# Patient Record
Sex: Male | Born: 1955
Health system: Southern US, Community
[De-identification: ages and names within clinical notes are randomized; demographics above are authoritative.]

## PROBLEM LIST (undated history)

## (undated) DIAGNOSIS — B0059 Other herpesviral disease of eye: Secondary | ICD-10-CM

## (undated) DIAGNOSIS — Z85828 Personal history of other malignant neoplasm of skin: Secondary | ICD-10-CM

## (undated) DIAGNOSIS — Z87891 Personal history of nicotine dependence: Secondary | ICD-10-CM

## (undated) DIAGNOSIS — B182 Chronic viral hepatitis C: Secondary | ICD-10-CM

## (undated) DIAGNOSIS — D649 Anemia, unspecified: Secondary | ICD-10-CM

## (undated) DIAGNOSIS — H269 Unspecified cataract: Secondary | ICD-10-CM

## (undated) DIAGNOSIS — K7460 Unspecified cirrhosis of liver: Secondary | ICD-10-CM

## (undated) DIAGNOSIS — E785 Hyperlipidemia, unspecified: Secondary | ICD-10-CM

## (undated) DIAGNOSIS — G44009 Cluster headache syndrome, unspecified, not intractable: Principal | ICD-10-CM

## (undated) HISTORY — DX: Unspecified cataract: H26.9

## (undated) HISTORY — DX: Cluster headache syndrome, unspecified, not intractable: G44.009

## (undated) HISTORY — DX: Anemia, unspecified: D64.9

## (undated) HISTORY — DX: Personal history of nicotine dependence: Z87.891

## (undated) HISTORY — DX: Hyperlipidemia, unspecified: E78.5

## (undated) HISTORY — DX: Chronic viral hepatitis C: B18.2

## (undated) HISTORY — DX: Unspecified cirrhosis of liver: K74.60

## (undated) HISTORY — DX: Personal history of other malignant neoplasm of skin: Z85.828

## (undated) HISTORY — PX: WISDOM TOOTH EXTRACTION: SHX21

---

## 1993-08-06 DIAGNOSIS — G44009 Cluster headache syndrome, unspecified, not intractable: Secondary | ICD-10-CM

## 1993-08-06 HISTORY — DX: Cluster headache syndrome, unspecified, not intractable: G44.009

## 2003-03-29 ENCOUNTER — Emergency Department (HOSPITAL_COMMUNITY): Admission: EM | Admit: 2003-03-29 | Discharge: 2003-03-29 | Payer: Self-pay | Admitting: Emergency Medicine

## 2004-04-13 ENCOUNTER — Ambulatory Visit: Payer: Self-pay | Admitting: Internal Medicine

## 2004-04-19 ENCOUNTER — Ambulatory Visit: Payer: Self-pay | Admitting: Internal Medicine

## 2004-04-21 ENCOUNTER — Ambulatory Visit: Payer: Self-pay | Admitting: Internal Medicine

## 2004-04-26 ENCOUNTER — Ambulatory Visit: Payer: Self-pay | Admitting: Internal Medicine

## 2012-06-20 ENCOUNTER — Emergency Department (HOSPITAL_COMMUNITY)
Admission: EM | Admit: 2012-06-20 | Discharge: 2012-06-20 | Disposition: A | Discharge status: Home / Self Care | Payer: BC Managed Care – PPO | Source: Home / Self Care | Attending: Family Medicine | Admitting: Family Medicine

## 2012-06-20 ENCOUNTER — Encounter (HOSPITAL_COMMUNITY): Payer: Self-pay

## 2012-06-20 DIAGNOSIS — B0239 Other herpes zoster eye disease: Secondary | ICD-10-CM

## 2012-06-20 HISTORY — DX: Other herpesviral disease of eye: B00.59

## 2012-06-20 MED ORDER — VALACYCLOVIR HCL 1 G PO TABS: 1000.0000 mg | ORAL_TABLET | Freq: Three times a day (TID) | ORAL | Status: DC

## 2012-06-20 MED ORDER — ACYCLOVIR 5 % EX OINT: TOPICAL_OINTMENT | CUTANEOUS | Status: DC

## 2012-06-20 NOTE — ED Notes (Signed)
Reported hist of herpetic issues of eye , feels as if it is coming back

## 2012-06-20 NOTE — ED Provider Notes (Signed)
History     CSN: [id number]  Arrival date & time 06/20/12  1055   First MD Initiated Contact with Patient 06/20/12 1114      Chief Complaint  Patient presents with  . Eye Problem    (Consider location/radiation/quality/duration/timing/severity/associated sxs/prior treatment) Patient is a 56 y.o. male presenting with eye problem. The history is provided by the patient.  Eye Problem  This is a recurrent problem. The current episode started 3 to 5 hours ago. The problem has not changed since onset.The left eye is affected.Injury mechanism: h/o recurrent herpes outbreaks, none in 3 yrs. Pertinent negatives include no photophobia.    Past Medical History  Diagnosis Date  . Herpes simplex eyelid dermatitis     History reviewed. No pertinent past surgical history.  History reviewed. No pertinent family history.  History  Substance Use Topics  . Smoking status: Current Every Day Smoker -- 0.5 packs/day    Types: Cigarettes  . Smokeless tobacco: Not on file  . Alcohol Use: No      Review of Systems  Constitutional: Negative.   Eyes: Negative for photophobia, pain, itching and visual disturbance.  Skin: Positive for rash.    Allergies  Review of patient's allergies indicates no known allergies.  Home Medications   Current Outpatient Rx  Name  Route  Sig  Dispense  Refill  . ACYCLOVIR 5 % EX OINT   Topical   Apply topically every 3 (three) hours.   3 g   1   . VALACYCLOVIR HCL 1 G PO TABS   Oral   Take 1 tablet (1,000 mg total) by mouth 3 (three) times daily.   21 tablet   1     BP 141/91  Pulse 78  Temp 98.2 F (36.8 C) (Oral)  Resp 16  SpO2 95%  Physical Exam  Nursing note and vitals reviewed. Constitutional: He appears well-developed and well-nourished.  Skin: Skin is warm and dry. There is erythema.       ED Course  Procedures (including critical care time)  Labs Reviewed - No data to display No results found.   1. Herpes zoster  dermatitis of eyelid       MDM         Linna Hoff, MD 06/20/12 1214

## 2012-08-06 HISTORY — PX: COLONOSCOPY: SHX174

## 2013-02-27 ENCOUNTER — Other Ambulatory Visit (INDEPENDENT_AMBULATORY_CARE_PROVIDER_SITE_OTHER): Payer: BC Managed Care – PPO

## 2013-02-27 ENCOUNTER — Encounter: Payer: Self-pay | Admitting: Internal Medicine

## 2013-02-27 ENCOUNTER — Ambulatory Visit (INDEPENDENT_AMBULATORY_CARE_PROVIDER_SITE_OTHER): Payer: BC Managed Care – PPO | Admitting: Internal Medicine

## 2013-02-27 VITALS — BP 118/78 | HR 77 | Temp 97.7°F | Resp 16 | Ht 71.0 in

## 2013-02-27 DIAGNOSIS — Z0000 Encounter for general adult medical examination without abnormal findings: Secondary | ICD-10-CM | POA: Insufficient documentation

## 2013-02-27 DIAGNOSIS — F17200 Nicotine dependence, unspecified, uncomplicated: Secondary | ICD-10-CM | POA: Insufficient documentation

## 2013-02-27 DIAGNOSIS — Z23 Encounter for immunization: Secondary | ICD-10-CM

## 2013-02-27 DIAGNOSIS — G44009 Cluster headache syndrome, unspecified, not intractable: Secondary | ICD-10-CM

## 2013-02-27 DIAGNOSIS — R7989 Other specified abnormal findings of blood chemistry: Secondary | ICD-10-CM

## 2013-02-27 DIAGNOSIS — B1920 Unspecified viral hepatitis C without hepatic coma: Secondary | ICD-10-CM

## 2013-02-27 LAB — COMPREHENSIVE METABOLIC PANEL
Alkaline Phosphatase: 54 U/L (ref 39–117)
BUN: 15 mg/dL (ref 6–23)
CO2: 25 mEq/L (ref 19–32)
Creatinine, Ser: 0.8 mg/dL (ref 0.4–1.5)
GFR: 101.48 mL/min (ref >60.00)
Glucose, Bld: 84 mg/dL (ref 70–99)
Total Bilirubin: 0.7 mg/dL (ref 0.3–1.2)
Total Protein: 7.7 g/dL (ref 6.0–8.3)

## 2013-02-27 LAB — CBC WITH DIFFERENTIAL/PLATELET
Basophils Relative: 0.6 % (ref 0.0–3.0)
Eosinophils Relative: 1.7 % (ref 0.0–5.0)
HCT: 49.3 % (ref 39.0–52.0)
Hemoglobin: 17.1 g/dL — ABNORMAL HIGH (ref 13.0–17.0)
Lymphs Abs: 2 10*3/uL (ref 0.7–4.0)
Monocytes Relative: 9.3 % (ref 3.0–12.0)
Platelets: 252 10*3/uL (ref 150.0–400.0)
RBC: 4.84 Mil/uL (ref 4.22–5.81)
WBC: 5.2 10*3/uL (ref 4.5–10.5)

## 2013-02-27 LAB — LIPID PANEL
Cholesterol: 179 mg/dL (ref 0–200)
Total CHOL/HDL Ratio: 5
VLDL: 40.4 mg/dL — ABNORMAL HIGH (ref 0.0–40.0)

## 2013-02-27 LAB — HEPATITIS C ANTIBODY: HCV Ab: REACTIVE — AB

## 2013-02-27 LAB — LDL CHOLESTEROL, DIRECT: Direct LDL: 111.5 mg/dL

## 2013-02-27 MED ORDER — ELETRIPTAN HYDROBROMIDE 40 MG PO TABS: 40.0000 mg | ORAL_TABLET | ORAL | Status: DC | PRN

## 2013-02-27 NOTE — Progress Notes (Signed)
  Subjective:    Patient ID: Micheal Doyle, male    DOB: 12/26/1955, 57 y.o.   MRN: [id number]  HPI  New to me he has a history of cluster headache, last headache was about 18 months ago. He wants to keep a medication on hand to use if he develops another headache. He feels well today and offers no complaints.   Review of Systems  Constitutional: Negative.  Negative for fever, chills, diaphoresis, activity change, appetite change, fatigue and unexpected weight change.  HENT: Negative.   Eyes: Negative.   Respiratory: Negative.  Negative for cough, chest tightness, shortness of breath, wheezing and stridor.   Cardiovascular: Negative.  Negative for chest pain, palpitations and leg swelling.  Gastrointestinal: Negative.  Negative for nausea, vomiting, abdominal pain, diarrhea, constipation and blood in stool.  Endocrine: Negative.   Genitourinary: Negative.  Negative for dysuria, urgency, frequency, hematuria, decreased urine volume, scrotal swelling and difficulty urinating.  Musculoskeletal: Negative.   Skin: Negative.   Allergic/Immunologic: Negative.   Neurological: Negative.  Negative for dizziness and weakness.  Hematological: Negative.  Negative for adenopathy. Does not bruise/bleed easily.  Psychiatric/Behavioral: Negative.        Objective:   Physical Exam  Vitals reviewed. Constitutional: He is oriented to person, place, and time. He appears well-developed and well-nourished. No distress.  HENT:  Head: Normocephalic and atraumatic.  Mouth/Throat: Oropharynx is clear and moist. No oropharyngeal exudate.  Eyes: Conjunctivae are normal. Right eye exhibits no discharge. Left eye exhibits no discharge. No scleral icterus.  Neck: Normal range of motion. Neck supple. No JVD present. No tracheal deviation present. No thyromegaly present.  Cardiovascular: Normal rate, regular rhythm, normal heart sounds and intact distal pulses.  Exam reveals no gallop and no friction rub.   No  murmur heard. Pulmonary/Chest: Effort normal and breath sounds normal. No stridor. No respiratory distress. He has no wheezes. He has no rales. He exhibits no tenderness.  Abdominal: Soft. Bowel sounds are normal. He exhibits no distension and no mass. There is no tenderness. There is no rebound and no guarding. Hernia confirmed negative in the right inguinal area and confirmed negative in the left inguinal area.  Genitourinary: Rectum normal, prostate normal and testes normal. Rectal exam shows no external hemorrhoid, no internal hemorrhoid, no fissure, no mass, no tenderness and anal tone normal. Guaiac negative stool. Prostate is not enlarged and not tender. Right testis shows no mass, no swelling and no tenderness. Right testis is descended. Left testis shows no mass, no swelling and no tenderness. Left testis is descended. Circumcised. No penile erythema or penile tenderness. No discharge found.  Musculoskeletal: Normal range of motion. He exhibits no edema and no tenderness.  Lymphadenopathy:    He has no cervical adenopathy.       Right: No inguinal adenopathy present.       Left: No inguinal adenopathy present.  Neurological: He is oriented to person, place, and time.  Skin: Skin is warm and dry. No rash noted. He is not diaphoretic. No erythema. No pallor.  Psychiatric: He has a normal mood and affect. His behavior is normal. Judgment and thought content normal.      No results found for this basename: WBC, HGB, HCT, PLT, GLUCOSE, CHOL, TRIG, HDL, LDLDIRECT, LDLCALC, ALT, AST, NA, K, CL, CREATININE, BUN, CO2, TSH, PSA, INR, GLUF, HGBA1C, MICROALBUR      Assessment & Plan:

## 2013-02-27 NOTE — Patient Instructions (Signed)
Health Maintenance, Males A healthy lifestyle and preventative care can promote health and wellness.  Maintain regular health, dental, and eye exams.  Eat a healthy diet. Foods like vegetables, fruits, whole grains, low-fat dairy products, and lean protein foods contain the nutrients you need without too many calories. Decrease your intake of foods high in solid fats, added sugars, and salt. Get information about a proper diet from your caregiver, if necessary.  Regular physical exercise is one of the most important things you can do for your health. Most adults should get at least 150 minutes of moderate-intensity exercise (any activity that increases your heart rate and causes you to sweat) each week. In addition, most adults need muscle-strengthening exercises on 2 or more days a week.   Maintain a healthy weight. The body mass index (BMI) is a screening tool to identify possible weight problems. It provides an estimate of body fat based on height and weight. Your caregiver can help determine your BMI, and can help you achieve or maintain a healthy weight. For adults 20 years and older:  A BMI below 18.5 is considered underweight.  A BMI of 18.5 to 24.9 is normal.  A BMI of 25 to 29.9 is considered overweight.  A BMI of 30 and above is considered obese.  Maintain normal blood lipids and cholesterol by exercising and minimizing your intake of saturated fat. Eat a balanced diet with plenty of fruits and vegetables. Blood tests for lipids and cholesterol should begin at age 20 and be repeated every 5 years. If your lipid or cholesterol levels are high, you are over 50, or you are a high risk for heart disease, you may need your cholesterol levels checked more frequently.Ongoing high lipid and cholesterol levels should be treated with medicines, if diet and exercise are not effective.  If you smoke, find out from your caregiver how to quit. If you do not use tobacco, do not start.  If you  choose to drink alcohol, do not exceed 2 drinks per day. One drink is considered to be 12 ounces (355 mL) of beer, 5 ounces (148 mL) of wine, or 1.5 ounces (44 mL) of liquor.  Avoid use of street drugs. Do not share needles with anyone. Ask for help if you need support or instructions about stopping the use of drugs.  High blood pressure causes heart disease and increases the risk of stroke. Blood pressure should be checked at least every 1 to 2 years. Ongoing high blood pressure should be treated with medicines if weight loss and exercise are not effective.  If you are 45 to 57 years old, ask your caregiver if you should take aspirin to prevent heart disease.  Diabetes screening involves taking a blood sample to check your fasting blood sugar level. This should be done once every 3 years, after age 45, if you are within normal weight and without risk factors for diabetes. Testing should be considered at a younger age or be carried out more frequently if you are overweight and have at least 1 risk factor for diabetes.  Colorectal cancer can be detected and often prevented. Most routine colorectal cancer screening begins at the age of 50 and continues through age 75. However, your caregiver may recommend screening at an earlier age if you have risk factors for colon cancer. On a yearly basis, your caregiver may provide home test kits to check for hidden blood in the stool. Use of a small camera at the end of a tube,   to directly examine the colon (sigmoidoscopy or colonoscopy), can detect the earliest forms of colorectal cancer. Talk to your caregiver about this at age 24, when routine screening begins. Direct examination of the colon should be repeated every 5 to 10 years through age 80, unless early forms of pre-cancerous polyps or small growths are found.  Hepatitis C blood testing is recommended for all people born from [birth year] through 1965 and any individual with known risks for hepatitis C.  Healthy  men should no longer receive prostate-specific antigen (PSA) blood tests as part of routine cancer screening. Consult with your caregiver about prostate cancer screening.  Testicular cancer screening is not recommended for adolescents or adult males who have no symptoms. Screening includes self-exam, caregiver exam, and other screening tests. Consult with your caregiver about any symptoms you have or any concerns you have about testicular cancer.  Practice safe sex. Use condoms and avoid high-risk sexual practices to reduce the spread of sexually transmitted infections (STIs).  Use sunscreen with a sun protection factor (SPF) of 30 or greater. Apply sunscreen liberally and repeatedly throughout the day. You should seek shade when your shadow is shorter than you. Protect yourself by wearing long sleeves, pants, a wide-brimmed hat, and sunglasses year round, whenever you are outdoors.  Notify your caregiver of new moles or changes in moles, especially if there is a change in shape or color. Also notify your caregiver if a mole is larger than the size of a pencil eraser.  A one-time screening for abdominal aortic aneurysm (AAA) and surgical repair of large AAAs by sound wave imaging (ultrasonography) is recommended for ages 69 to 19 years who are current or former smokers.  Stay current with your immunizations. Document Released: 01/19/2008 Document Revised: 10/15/2011 Document Reviewed: 12/18/2010 [facility] Patient Information 2014 [place], [place]. Cluster Headache Cluster headaches are recognized by their pattern of deep, intense head pain. They normally occur on one side of the head. Typically, cluster headaches:  Are severe in nature.  Occur repeatedly over weeks to months at a time and are then followed by periods of no headaches.  Can last 15 minutes to 3 hours.  Occur at the same time each day, often at night.  Occur several times a day. CAUSES The exact cause of a cluster headache is  not known. Some things can trigger a cluster headache, such as:  Smoking.  Alcohol use.  Lack of sleep.  High altitude travel, such as airline travel.  Change of seasons.  Certain foods, such as foods or drinks that contain nitrates, glutamate, aspartame, or tyramine. SYMPTOMS   Severe pain that begins in or around the eye or temple.  One-sided head pain.  Feeling sick to your stomach (nauseous).  Sensitivity to light.  Runny nose.  Eye redness, tearing, and nasal stuffiness on the side of your head where you are experiencing pain.  Sweaty, pale skin of the face.  Droopy eyelid. DIAGNOSIS  Cluster headaches are diagnosed based on symptoms and physical exam. Your caregiver may order a computerized X-ray scan (CT or CAT scan) or a computerized magnetic scan (MRI) of your head or other lab tests to see if your headaches are caused from other medical conditions.  TREATMENT   Medications for pain relief and to prevent recurrent attacks.  Oxygen for pain relief.  Biofeedback programs may help reduce headache pain. Some people may need a combination of medicines for cluster headaches. It may be helpful to keep a headache diary. This may help  you find a trend for what is triggering your headaches. Your caregiver can develop a treatment plan that can be helpful to you.  HOME CARE INSTRUCTIONS  During cluster periods:  Follow a regular sleep schedule.Do not vary the amount and time that you sleep from day to day. It is important to stay on the same schedule during a cluster period to help prevent headaches.  Avoid alcohol.  Stop smoking. SEEK IMMEDIATE MEDICAL CARE IF:   You have any changes from your previous cluster headaches either in intensity or frequency.  You are not getting relief from medicines you are taking.  You faint.  You develop weakness or numbness, especially on one side of your body or face.  You develop double vision.  You develop nausea or  vomiting.  You cannot keep your balance or have difficulty talking or walking.  You develop neck pain or neck stiffness.  You have a fever. Document Released: 07/23/2005 Document Revised: 10/15/2011 Document Reviewed: 10/21/2010 [facility] Patient Information 2014 [place], [place].

## 2013-02-27 NOTE — Assessment & Plan Note (Signed)
He will try relpax

## 2013-02-27 NOTE — Assessment & Plan Note (Signed)
Exam done Vaccines were reviewed and updated He was referred for a colonscopy Labs ordered Pt ed material was given

## 2013-02-27 NOTE — Assessment & Plan Note (Signed)
He is not ready to quit smoking 

## 2013-02-28 ENCOUNTER — Encounter: Payer: Self-pay | Admitting: Internal Medicine

## 2013-03-03 ENCOUNTER — Encounter: Payer: Self-pay | Admitting: Gastroenterology

## 2013-03-13 ENCOUNTER — Telehealth: Payer: Self-pay | Admitting: Internal Medicine

## 2013-03-13 DIAGNOSIS — B182 Chronic viral hepatitis C: Secondary | ICD-10-CM | POA: Insufficient documentation

## 2013-03-13 DIAGNOSIS — R768 Other specified abnormal immunological findings in serum: Secondary | ICD-10-CM

## 2013-03-13 NOTE — Telephone Encounter (Signed)
Pt.notified

## 2013-03-13 NOTE — Telephone Encounter (Signed)
Patient would like to come in Monday to have his repeat labs, can orders be placed in the system, thanks

## 2013-03-13 NOTE — Telephone Encounter (Signed)
Lab orders placed.  

## 2013-03-16 ENCOUNTER — Other Ambulatory Visit (INDEPENDENT_AMBULATORY_CARE_PROVIDER_SITE_OTHER): Payer: BC Managed Care – PPO

## 2013-03-16 DIAGNOSIS — R768 Other specified abnormal immunological findings in serum: Secondary | ICD-10-CM

## 2013-03-16 DIAGNOSIS — R894 Abnormal immunological findings in specimens from other organs, systems and tissues: Secondary | ICD-10-CM

## 2013-03-17 LAB — HEPATITIS B CORE ANTIBODY, TOTAL: Hep B Core Total Ab: POSITIVE — AB

## 2013-03-17 LAB — HEPATITIS C RNA QUANTITATIVE
HCV Quantitative Log: 6.53 {Log} — ABNORMAL HIGH (ref <1.18)
HCV Quantitative: 3359463 IU/mL — ABNORMAL HIGH (ref <15)

## 2013-03-17 LAB — HEPATITIS A ANTIBODY, TOTAL: Hep A Total Ab: POSITIVE — AB

## 2013-03-18 ENCOUNTER — Encounter: Payer: Self-pay | Admitting: Internal Medicine

## 2013-04-28 ENCOUNTER — Other Ambulatory Visit: Payer: BC Managed Care – PPO

## 2013-04-28 ENCOUNTER — Ambulatory Visit (AMBULATORY_SURGERY_CENTER): Payer: Self-pay

## 2013-04-28 ENCOUNTER — Encounter: Payer: Self-pay | Admitting: Gastroenterology

## 2013-04-28 VITALS — Ht 71.0 in | Wt 196.2 lb

## 2013-04-28 DIAGNOSIS — Z1211 Encounter for screening for malignant neoplasm of colon: Secondary | ICD-10-CM

## 2013-04-28 MED ORDER — NA SULFATE-K SULFATE-MG SULF 17.5-3.13-1.6 GM/177ML PO SOLN: ORAL | Status: DC

## 2013-05-08 ENCOUNTER — Encounter: Payer: Self-pay | Admitting: Gastroenterology

## 2013-05-08 ENCOUNTER — Ambulatory Visit (AMBULATORY_SURGERY_CENTER): Payer: BC Managed Care – PPO | Admitting: Gastroenterology

## 2013-05-08 VITALS — BP 112/78 | HR 60 | Temp 96.8°F | Resp 18 | Ht 71.0 in | Wt 196.0 lb

## 2013-05-08 DIAGNOSIS — D126 Benign neoplasm of colon, unspecified: Secondary | ICD-10-CM

## 2013-05-08 DIAGNOSIS — Z1211 Encounter for screening for malignant neoplasm of colon: Secondary | ICD-10-CM

## 2013-05-08 DIAGNOSIS — K648 Other hemorrhoids: Secondary | ICD-10-CM

## 2013-05-08 MED ORDER — SODIUM CHLORIDE 0.9 % IV SOLN: 500.0000 mL | INTRAVENOUS | Status: DC

## 2013-05-08 NOTE — Patient Instructions (Addendum)
YOU HAD AN ENDOSCOPIC PROCEDURE TODAY AT THE [facility]: Refer to the procedure report that was given to you for any specific questions about what was found during the examination.  If the procedure report does not answer your questions, please call your gastroenterologist to clarify.  If you requested that your care partner not be given the details of your procedure findings, then the procedure report has been included in a sealed envelope for you to review at your convenience later.  YOU SHOULD EXPECT: Some feelings of bloating in the abdomen. Passage of more gas than usual.  Walking can help get rid of the air that was put into your GI tract during the procedure and reduce the bloating. If you had a lower endoscopy (such as a colonoscopy or flexible sigmoidoscopy) you may notice spotting of blood in your stool or on the toilet paper. If you underwent a bowel prep for your procedure, then you may not have a normal bowel movement for a few days.  DIET: Your first meal following the procedure should be a light meal and then it is ok to progress to your normal diet.  A half-sandwich or bowl of soup is an example of a good first meal.  Heavy or fried foods are harder to digest and may make you feel nauseous or bloated.  Likewise meals heavy in dairy and vegetables can cause extra gas to form and this can also increase the bloating.  Drink plenty of fluids but you should avoid alcoholic beverages for 24 hours.  Try to increase the fiber in your diet due to your hemorrhoids.  ACTIVITY: Your care partner should take you home directly after the procedure.  You should plan to take it easy, moving slowly for the rest of the day.  You can resume normal activity the day after the procedure however you should NOT DRIVE or use heavy machinery for 24 hours (because of the sedation medicines used during the test).    SYMPTOMS TO REPORT IMMEDIATELY: A gastroenterologist can be reached at any hour.  During  normal business hours, 8:30 AM to 5:00 PM Monday through Friday, call [phone].  After hours and on weekends, please call the GI answering service at [phone] who will take a message and have the physician on call contact you.   Following lower endoscopy (colonoscopy or flexible sigmoidoscopy):  Excessive amounts of blood in the stool  Significant tenderness or worsening of abdominal pains  Swelling of the abdomen that is new, acute  Fever of 100F or higher  FOLLOW UP: If any biopsies were taken you will be contacted by phone or by letter within the next 1-3 weeks.  Call your gastroenterologist if you have not heard about the biopsies in 3 weeks.  Our staff will call the home number listed on your records the next business day following your procedure to check on you and address any questions or concerns that you may have at that time regarding the information given to you following your procedure. This is a courtesy call and so if there is no answer at the home number and we have not heard from you through the emergency physician on call, we will assume that you have returned to your regular daily activities without incident.  SIGNATURES/CONFIDENTIALITY: You and/or your care partner have signed paperwork which will be entered into your electronic medical record.  These signatures attest to the fact that that the information above on your After Visit Summary has  been reviewed and is understood.  Full responsibility of the confidentiality of this discharge information lies with you and/or your care-partner.

## 2013-05-08 NOTE — Progress Notes (Signed)
Patient did not experience any of the following events: a burn prior to discharge; a fall within the facility; wrong site/side/patient/procedure/implant event; or a hospital transfer or hospital admission upon discharge from the facility. (G8907)Patient did not have preoperative order for IV antibiotic SSI prophylaxis. (G8918) ewm 

## 2013-05-08 NOTE — Progress Notes (Signed)
Called to room to assist during endoscopic procedure.  Patient ID and intended procedure confirmed with present staff. Received instructions for my participation in the procedure from the performing physician.  

## 2013-05-08 NOTE — Progress Notes (Signed)
stable °

## 2013-05-08 NOTE — Op Note (Signed)
[facility] [address] [place], [address]   COLONOSCOPY PROCEDURE REPORT  PATIENT: Micheal, Doyle  MR#: [id number] BIRTHDATE: [birth year] , 57  yrs. old GENDER: Male ENDOSCOPIST: Louis Meckel, MD REFERRED [id number] Karsten Ro, M.D. PROCEDURE DATE:  05/08/2013 PROCEDURE:   Colonoscopy with snare polypectomy and Colonoscopy with cold biopsy polypectomy First Screening Colonoscopy - Avg.  risk and is 50 yrs.  old or older Yes.  Prior Negative Screening - Now for repeat screening. N/A  History of Adenoma - Now for follow-up colonoscopy & has been > or = to 3 yrs.  N/A  Polyps Removed Today? Yes. ASA CLASS:   Class I INDICATIONS:elevated risk screening. MEDICATIONS: MAC sedation, administered by CRNA and propofol (Diprivan) 300mg  IV  DESCRIPTION OF PROCEDURE:   After the risks benefits and alternatives of the procedure were thoroughly explained, informed consent was obtained.  A digital rectal exam revealed no abnormalities of the rectum.   The [id number]  endoscope was introduced through the anus and advanced to the cecum, which was identified by both the appendix and ileocecal valve. No adverse events experienced.   The quality of the prep was excellent using Suprep  The instrument was then slowly withdrawn as the colon was fully examined.      COLON FINDINGS: A sessile polyp measuring 8 mm in size was found in the ascending colon.  A polypectomy was performed with a cold snare.  The resection was complete and the polyp tissue was completely retrieved.   Two sessile polyps measuring 5 mm in size were found in the proximal descending colon.  A polypectomy was performed with a cold snare.  The resection was complete and the polyp tissue was completely retrieved.   A sessile polyp measuring 2 mm in size was found in the distal descending colon.  A polypectomy was performed with cold forceps.   A sessile polyp measuring 5 mm in size was found in the  sigmoid colon.  A polypectomy was performed with a cold snare.  The resection was complete and the polyp tissue was completely retrieved.   Internal hemorrhoids were found.  Retroflexed views revealed no abnormalities. The time to cecum=1 minutes 38 seconds.  Withdrawal time=8 minutes 0 seconds.  The scope was withdrawn and the procedure completed. COMPLICATIONS: There were no complications.  ENDOSCOPIC IMPRESSION: 1.   Sessile polyp measuring 8 mm in size was found in the ascending colon; polypectomy was performed with a cold snare 2.   Two sessile polyps measuring 5 mm in size were found in the proximal descending colon; polypectomy was performed with a cold snare 3.   Sessile polyp measuring 2 mm in size was found in the distal descending colon; polypectomy was performed with cold forceps 4.   Sessile polyp measuring 5 mm in size was found in the sigmoid colon; polypectomy was performed with a cold snare 5.   Internal hemorrhoids  RECOMMENDATIONS: Colonoscopy 3 years   eSigned:  Louis Meckel, MD 05/08/2013 12:23 PM   cc:   PATIENT NAME:  Micheal, Doyle MR#: [id number]

## 2013-05-11 ENCOUNTER — Telehealth: Payer: Self-pay | Admitting: *Deleted

## 2013-05-11 NOTE — Telephone Encounter (Signed)
No answer, unable to leave a message for the patient related to mailbox is full.

## 2013-05-13 ENCOUNTER — Encounter: Payer: Self-pay | Admitting: Gastroenterology

## 2013-05-15 LAB — HM COLONOSCOPY

## 2013-06-10 ENCOUNTER — Ambulatory Visit: Payer: BC Managed Care – PPO | Admitting: Internal Medicine

## 2013-06-17 ENCOUNTER — Ambulatory Visit (INDEPENDENT_AMBULATORY_CARE_PROVIDER_SITE_OTHER): Payer: BC Managed Care – PPO | Admitting: Internal Medicine

## 2013-06-17 ENCOUNTER — Encounter: Payer: Self-pay | Admitting: Internal Medicine

## 2013-06-17 VITALS — BP 120/78 | HR 77 | Temp 98.2°F | Resp 16 | Ht 71.0 in | Wt 196.0 lb

## 2013-06-17 DIAGNOSIS — Z23 Encounter for immunization: Secondary | ICD-10-CM

## 2013-06-17 DIAGNOSIS — B1920 Unspecified viral hepatitis C without hepatic coma: Secondary | ICD-10-CM

## 2013-06-17 NOTE — Progress Notes (Signed)
Pre visit review using our clinic review tool, if applicable. No additional management support is needed unless otherwise documented below in the visit note. 

## 2013-06-17 NOTE — Patient Instructions (Signed)
Hepatitis C °Hepatitis C is a viral infection of the liver. Infection may go undetected for months or years because symptoms may be absent or very mild. Chronic liver disease is the main danger of hepatitis C. This may lead to scarring of the liver (cirrhosis), liver failure, and liver cancer. °CAUSES  °Hepatitis C is caused by the hepatitis C virus (HCV). Formerly, hepatitis C infections were most commonly transmitted through blood transfusions. In the early 1990s, routine testing of donated blood for hepatitis C and exclusion of blood that tests positive for HCV began. Now, HCV is most commonly transmitted from person to person through injection drug use, sharing needles, or sex with an infected person. A caregiver may also get the infection from exposure to the blood of an infected patient by way of a cut or needle stick.  °SYMPTOMS  °Acute Phase °Many cases of acute HCV infection are mild and cause few problems. Some people may not even realize they are sick. Symptoms in others may last a few weeks to several months and include: °· Feeling very tired. °· Loss of appetite. °· Nausea. °· Vomiting. °· Abdominal pain. °· Dark yellow urine. °· Yellow skin and eyes (jaundice). °· Itching of the skin. °Chronic Phase °· Between 50% to 85% of people who get HCV infection become "chronic carriers." They often have no symptoms, but the virus stays in their body. They may spread the virus to others and can get long-term liver disease. °· Many people with chronic HCV infection remain healthy for many years. However, up to 1 in 5 chronically infected people may develop severe liver diseases including scarring of the liver (cirrhosis), liver failure, or liver cancer. °DIAGNOSIS  °Diagnosis of hepatitis C infection is made by testing blood for the presence of hepatitis C viral particles called RNA. Other tests may also be done to measure the status of current liver function, exclude other liver problems, or assess liver  damage. °TREATMENT  °Treatment with many antiviral drugs is available and recommended for some patients with chronic HCV infection. Drug treatment is generally considered appropriate for patients who: °· Are 18 years of age or older. °· Have a positive test for HCV particles in the blood. °· Have a liver tissue sample (biopsy) that shows chronic hepatitis and significant scarring (fibrosis). °· Do not have signs of liver failure. °· Have acceptable blood test results that confirm the wellness of other body organs. °· Are willing to be treated and conform to treatment requirements. °· Have no other circumstances that would prevent treatment from being recommended (contraindications). °All people who are offered and choose to receive drug treatment must understand that careful medical follow up for many months and even years is crucial in order to make successful care possible. The goal of drug treatment is to eliminate any evidence of HCV in the blood on a long-term basis. This is called a "sustained virologic response" or SVR. Achieving a SVR is associated with a decrease in the chance of life-threatening liver problems, need for a liver transplant, liver cancer rates, and liver-related complications. °Successful treatment currently requires taking treatment drugs for at least 24 weeks and up to 72 weeks. An injected drug (interferon) given weekly and an oral antiviral medicine taken daily are usually prescribed. Side effects from these drugs are common and some may be very serious. Your response to treatment must be carefully monitored by both you and your caregiver throughout the entire treatment period. °PREVENTION °There is no vaccine for hepatitis C. The only   way to prevent the disease is to reduce the risk of exposure to the virus.  °· Avoid sharing drug needles or personal items like toothbrushes, razors, and nail clippers with an infected person. °· Healthcare workers need to avoid injuries and wear  appropriate protective equipment such as gloves, gowns, and face masks when performing invasive medical or nursing procedures. °HOME CARE INSTRUCTIONS  °To avoid making your liver disease worse: °· Strictly avoid drinking alcohol. °· Carefully review all new prescriptions of medicines with your caregiver. Ask your caregiver which drugs you should avoid. The following drugs are toxic to the liver, and your caregiver may tell you to avoid them: °· Isoniazid. °· Methyldopa. °· Acetaminophen. °· Anabolic steroids (muscle-building drugs). °· Erythromycin. °· Oral contraceptives (birth control pills). °· Check with your caregiver to make sure medicine you are currently taking will not be harmful. °· Periodic blood tests may be required. Follow your caregiver's advice about when you should have blood tests. °· Avoid a sexual relationship until advised otherwise by your caregiver. °· Avoid activities that could expose other people to your blood. Examples include sharing a toothbrush, nail clippers, razors, and needles. °· Bed rest is not necessary, but it may make you feel better. Recovery time is not related to the amount of rest you receive. °· This infection is contagious. Follow your caregiver's instructions in order to avoid spread of the infection. °SEEK IMMEDIATE MEDICAL CARE IF: °· You have increasing fatigue or weakness. °· You have an oral temperature above 102° F (38.9° C), not controlled by medicine. °· You develop loss of appetite, nausea, or vomiting. °· You develop jaundice. °· You develop easy bruising or bleeding. °· You develop any severe problems as a result of your treatment. °MAKE SURE YOU:  °· Understand these instructions. °· Will watch your condition. °· Will get help right away if you are not doing well or get worse. °Document Released: 07/20/2000 Document Revised: 10/15/2011 Document Reviewed: 11/22/2010 °ExitCare® Patient Information ©2014 [employer]. ° °

## 2013-06-17 NOTE — Progress Notes (Signed)
  Subjective:    Patient ID: Micheal Doyle, male    DOB: 1956-05-20, 57 y.o.   MRN: [id number]  HPI  He returns for f/up on labs which confirm Hep C infection. He feels well and offers no complaints.  Review of Systems  Constitutional: Negative.  Negative for fever, chills, diaphoresis, appetite change and fatigue.  HENT: Negative.   Eyes: Negative.   Respiratory: Negative.  Negative for cough, chest tightness, shortness of breath, wheezing and stridor.   Cardiovascular: Negative.  Negative for chest pain, palpitations and leg swelling.  Gastrointestinal: Negative.  Negative for nausea, vomiting, abdominal pain, diarrhea, constipation and blood in stool.  Endocrine: Negative.   Genitourinary: Negative.   Musculoskeletal: Negative.   Skin: Negative.   Allergic/Immunologic: Negative.   Neurological: Negative.   Hematological: Negative.  Negative for adenopathy. Does not bruise/bleed easily.  Psychiatric/Behavioral: Negative.        Objective:   Physical Exam  Vitals reviewed. Constitutional: He is oriented to person, place, and time. He appears well-developed and well-nourished. No distress.  Eyes: Conjunctivae are normal. Right eye exhibits no discharge. Left eye exhibits no discharge. No scleral icterus.  Neck: Normal range of motion. Neck supple. No JVD present. No tracheal deviation present. No thyromegaly present.  Cardiovascular: Normal rate, regular rhythm, normal heart sounds and intact distal pulses.  Exam reveals no gallop and no friction rub.   No murmur heard. Pulmonary/Chest: Effort normal and breath sounds normal. No stridor. No respiratory distress. He has no wheezes. He has no rales. He exhibits no tenderness.  Abdominal: Soft. Bowel sounds are normal. He exhibits no distension and no mass. There is no tenderness. There is no rebound and no guarding.  Musculoskeletal: Normal range of motion. He exhibits no edema and no tenderness.  Lymphadenopathy:    He has no  cervical adenopathy.  Neurological: He is oriented to person, place, and time.  Skin: Skin is warm and dry. No rash noted. He is not diaphoretic. No erythema. No pallor.  Psychiatric: He has a normal mood and affect. His behavior is normal. Judgment and thought content normal.     Lab Results  Component Value Date   WBC 5.2 02/27/2013   HGB 17.1* 02/27/2013   HCT 49.3 02/27/2013   PLT 252.0 02/27/2013   GLUCOSE 84 02/27/2013   CHOL 179 02/27/2013   TRIG 202.0* 02/27/2013   HDL 33.50* 02/27/2013   LDLDIRECT 111.5 02/27/2013   ALT 57* 02/27/2013   AST 41* 02/27/2013   NA 138 02/27/2013   K 4.4 02/27/2013   CL 105 02/27/2013   CREATININE 0.8 02/27/2013   BUN 15 02/27/2013   CO2 25 02/27/2013   TSH 2.15 02/27/2013   PSA 0.22 02/27/2013   INR 1.0 03/16/2013       Assessment & Plan:

## 2013-06-17 NOTE — Assessment & Plan Note (Addendum)
He wants to be evaluated further and to consider treatment of this --> refer to the Hep C clinic

## 2013-06-19 NOTE — Addendum Note (Signed)
Addended by: Rock Nephew T on: 06/19/2013 02:32 PM   Modules accepted: Orders

## 2014-07-08 ENCOUNTER — Encounter: Payer: Self-pay | Admitting: Internal Medicine

## 2014-07-08 ENCOUNTER — Ambulatory Visit (INDEPENDENT_AMBULATORY_CARE_PROVIDER_SITE_OTHER): Payer: BC Managed Care – PPO | Admitting: Internal Medicine

## 2014-07-08 VITALS — BP 120/74 | HR 84 | Temp 97.9°F | Resp 16 | Ht 71.0 in | Wt 187.0 lb

## 2014-07-08 DIAGNOSIS — G44019 Episodic cluster headache, not intractable: Secondary | ICD-10-CM

## 2014-07-08 DIAGNOSIS — Z8669 Personal history of other diseases of the nervous system and sense organs: Secondary | ICD-10-CM | POA: Insufficient documentation

## 2014-07-08 DIAGNOSIS — Z8619 Personal history of other infectious and parasitic diseases: Secondary | ICD-10-CM | POA: Insufficient documentation

## 2014-07-08 DIAGNOSIS — Z23 Encounter for immunization: Secondary | ICD-10-CM

## 2014-07-08 MED ORDER — ELETRIPTAN HYDROBROMIDE 40 MG PO TABS: 40.0000 mg | ORAL_TABLET | ORAL | Status: DC | PRN

## 2014-07-08 MED ORDER — VALACYCLOVIR HCL 1 G PO TABS: 1000.0000 mg | ORAL_TABLET | Freq: Two times a day (BID) | ORAL | Status: DC

## 2014-07-08 NOTE — Progress Notes (Signed)
Pre visit review using our clinic review tool, if applicable. No additional management support is needed unless otherwise documented below in the visit note. 

## 2014-07-08 NOTE — Patient Instructions (Signed)
Cluster Headache Cluster headaches are recognized by their pattern of deep, intense head pain. They normally occur on one side of your head, but they may "switch sides" in subsequent episodes. Typically, cluster headaches:   Are severe in nature.   Occur repeatedly over weeks to months and are followed by periods of no headaches.   Can last from 15 minutes to 3 hours.   Occur at the same time each day, often at night.   Occur several times a day. CAUSES The exact cause of cluster headaches is not known. Alcohol use may be associated with cluster headaches. SIGNS AND SYMPTOMS   Severe pain that begins in or around your eye or temple.   One-sided head pain.   Feeling sick to your stomach (nauseous).   Sensitivity to light.   Runny nose.   Eye redness, tearing, and nasal stuffiness on the side of your head where you are experiencing pain.   Sweaty, pale skin of the face.   Droopy or swollen eyelid.   Restlessness. DIAGNOSIS  Cluster headaches are diagnosed based on symptoms and a physical exam. Your health care provider may order a CT scan or an MRI of your head or lab tests to see if your headaches are caused by other medical conditions.  TREATMENT   Medicines for pain relief and to prevent recurrent attacks. Some people may need a combination of medicines.  Oxygen for pain relief.   Biofeedback programs to help reduce headache pain.  It may be helpful to keep a headache diary. This may help you find a trend for what is triggering your headaches. Your health care provider can develop a treatment plan.  HOME CARE INSTRUCTIONS  During cluster periods:   Follow a regular sleep schedule. Do not vary the amount and time that you sleep from day to day. It is important to stay on the same schedule during a cluster period to help prevent headaches.   Avoid alcohol.   Stop smoking if you smoke.  SEEK MEDICAL CARE IF:  You have any changes from your previous  cluster headaches either in intensity or frequency.   You are not getting relief from medicines you are taking.  SEEK IMMEDIATE MEDICAL CARE IF:   You faint.   You have weakness or numbness, especially on one side of your body or face.   You have double vision.   You have nausea or vomiting that is not relieved within several hours.   You cannot keep your balance or have difficulty talking or walking.   You have neck pain or stiffness.   You have a fever. MAKE SURE YOU:  Understand these instructions.   Will watch your condition.   Will get help right away if you are not doing well or get worse. Document Released: 07/23/2005 Document Revised: 05/13/2013 Document Reviewed: 02/12/2013 ExitCare Patient Information 2015 [employer]. This information is not intended to replace advice given to you by your health care provider. Make sure you discuss any questions you have with your health care provider.  

## 2014-07-09 ENCOUNTER — Telehealth: Payer: Self-pay | Admitting: Internal Medicine

## 2014-07-09 NOTE — Telephone Encounter (Signed)
emmi mailed  °

## 2014-07-09 NOTE — Assessment & Plan Note (Signed)
Cont valtrex as needed

## 2014-07-09 NOTE — Assessment & Plan Note (Signed)
Cont relpax as needed

## 2014-07-09 NOTE — Progress Notes (Signed)
   Subjective:    Patient ID: Micheal Doyle, male    DOB: 1955-10-11, 58 y.o.   MRN: [id number]  HPI  He returns for f/up, he has not had any recent flares of HSV on his face but he fears that it could recur and he wants to have valtrex around to take if needed. His headaches occur rarely and when he does have one relpax works very well.  Review of Systems  All other systems reviewed and are negative.      Objective:   Physical Exam  Constitutional: He appears well-developed and well-nourished. No distress.  HENT:  Head: Normocephalic and atraumatic.  Mouth/Throat: Oropharynx is clear and moist. No oropharyngeal exudate.  Eyes: Conjunctivae are normal. Right eye exhibits no discharge. Left eye exhibits no discharge. No scleral icterus.  Neck: Normal range of motion. Neck supple. No JVD present. No tracheal deviation present. No thyromegaly present.  Cardiovascular: Normal rate, regular rhythm, normal heart sounds and intact distal pulses.  Exam reveals no gallop and no friction rub.   No murmur heard. Pulmonary/Chest: Effort normal and breath sounds normal. No stridor. No respiratory distress. He has no wheezes. He has no rales. He exhibits no tenderness.  Abdominal: Soft. Bowel sounds are normal. He exhibits no distension and no mass. There is no tenderness. There is no rebound and no guarding.  Musculoskeletal: Normal range of motion. He exhibits no edema or tenderness.  Lymphadenopathy:    He has no cervical adenopathy.  Skin: Skin is warm and dry. No rash noted. He is not diaphoretic. No erythema. No pallor.  Vitals reviewed.         Assessment & Plan:

## 2015-09-23 ENCOUNTER — Emergency Department (HOSPITAL_COMMUNITY): Payer: BLUE CROSS/BLUE SHIELD

## 2015-09-23 ENCOUNTER — Emergency Department (HOSPITAL_COMMUNITY)
Admission: EM | Admit: 2015-09-23 | Discharge: 2015-09-23 | Disposition: A | Discharge status: Home / Self Care | Payer: BLUE CROSS/BLUE SHIELD | Attending: Emergency Medicine | Admitting: Emergency Medicine

## 2015-09-23 ENCOUNTER — Encounter (HOSPITAL_COMMUNITY): Payer: Self-pay | Admitting: Family Medicine

## 2015-09-23 DIAGNOSIS — Z8669 Personal history of other diseases of the nervous system and sense organs: Secondary | ICD-10-CM | POA: Insufficient documentation

## 2015-09-23 DIAGNOSIS — Y9389 Activity, other specified: Secondary | ICD-10-CM | POA: Diagnosis not present

## 2015-09-23 DIAGNOSIS — Z8619 Personal history of other infectious and parasitic diseases: Secondary | ICD-10-CM | POA: Diagnosis not present

## 2015-09-23 DIAGNOSIS — W108XXA Fall (on) (from) other stairs and steps, initial encounter: Secondary | ICD-10-CM | POA: Insufficient documentation

## 2015-09-23 DIAGNOSIS — S63252A Unspecified dislocation of right middle finger, initial encounter: Secondary | ICD-10-CM | POA: Insufficient documentation

## 2015-09-23 DIAGNOSIS — Z79899 Other long term (current) drug therapy: Secondary | ICD-10-CM | POA: Diagnosis not present

## 2015-09-23 DIAGNOSIS — Z85828 Personal history of other malignant neoplasm of skin: Secondary | ICD-10-CM | POA: Insufficient documentation

## 2015-09-23 DIAGNOSIS — S6991XA Unspecified injury of right wrist, hand and finger(s), initial encounter: Secondary | ICD-10-CM | POA: Diagnosis present

## 2015-09-23 DIAGNOSIS — S63254A Unspecified dislocation of right ring finger, initial encounter: Secondary | ICD-10-CM | POA: Diagnosis not present

## 2015-09-23 DIAGNOSIS — Y9289 Other specified places as the place of occurrence of the external cause: Secondary | ICD-10-CM | POA: Insufficient documentation

## 2015-09-23 DIAGNOSIS — S63289A Dislocation of proximal interphalangeal joint of unspecified finger, initial encounter: Secondary | ICD-10-CM

## 2015-09-23 DIAGNOSIS — F17210 Nicotine dependence, cigarettes, uncomplicated: Secondary | ICD-10-CM | POA: Insufficient documentation

## 2015-09-23 DIAGNOSIS — Y998 Other external cause status: Secondary | ICD-10-CM | POA: Insufficient documentation

## 2015-09-23 MED ORDER — BUPIVACAINE HCL 0.25 % IJ SOLN: 5.0000 mL | Freq: Once | INTRAMUSCULAR | Status: DC

## 2015-09-23 MED ORDER — HYDROCODONE-ACETAMINOPHEN 5-325 MG PO TABS: 1.0000 | ORAL_TABLET | ORAL | Status: DC | PRN

## 2015-09-23 MED ORDER — BUPIVACAINE HCL (PF) 0.25 % IJ SOLN
10.0000 mL | Freq: Once | INTRAMUSCULAR | Status: AC
  Administered 2015-09-23: 10 mL
  Filled 2015-09-23: qty 30
  Filled 2015-09-23: qty 10

## 2015-09-23 NOTE — ED Notes (Signed)
Pharmacy called for Bupivicaine.

## 2015-09-23 NOTE — ED Provider Notes (Signed)
  Face-to-face evaluation   History: He fell into the fingers of the right hand, injuring them today. He denies head or neck injury.  Physical exam: Alert, calm, cooperative. Deformities of right fingers 3 and 4 at the PIP joints. Distal to this joint, the fingers are ulnar deviated. Neurovascular intact distally in the fingers.  Procedure- observed by me, reduction of PIP joint dislocations, by the 53 assistant. Assisted with reduction by stabilizing the patient's right hand during these procedures. Post- reduction- he has normal range of motion of the fingers, and normal circulation.  Medical screening examination/treatment/procedure(s) were conducted as a shared visit with non-physician practitioner(s) and myself.  I personally evaluated the patient during the encounter  Daleen Bo, MD 09/23/15 1520

## 2015-09-23 NOTE — Discharge Instructions (Signed)
Schedule a follow-up with the hand surgeon Dr. Lenon Curt. Use pain medication prescribed for pain control. Ice your finger for pain control and swelling. Keep hand in splint applied at the emergency department.   Finger Dislocation Finger dislocation is the displacement of bones in your finger at the joints. Most commonly, finger dislocation occurs at the proximal interphalangeal joint (the joint closest to your knuckle). Very strong, fibrous tissues (ligaments) and joint capsules connect the three bones of your fingers.  CAUSES Dislocation is caused by a forceful impact. This impact moves these bones off the joint and often tears your ligaments.  SYMPTOMS Symptoms of finger dislocation include:  Deformity of your finger.  Pain, with loss of movement. DIAGNOSIS  Finger dislocation is diagnosed with a physical exam. Often, X-ray exams are done to see if you have associated injuries, such as bone fractures. TREATMENT  Finger dislocations are treated by putting your bones back into position (reduction) either by manually moving the bones back into place or through surgery. Your finger is then kept in a fixed position (immobilized) with the use of a dressing or splint for a brief period. When your ligament has to be surgically repaired, it needs to be kept in a fixed position with a dressing or splint for 1 to 2 weeks. Because joint stiffness is a long-term complication of finger dislocation, hand exercises or physical therapy to increase the range of motion and to regain strength is usually started as soon as the ligament is healed. Exercises and therapy generally last no more than 3 months. HOME CARE INSTRUCTIONS The following measures can help to reduce pain and speed up the healing process:  Rest your injured joint. Do not move until instructed otherwise by your caregiver. Avoid activities similar to the one that caused your injury.  Apply ice to your injured joint for the first day or 2 after  your reduction or as directed by your caregiver. Applying ice helps to reduce inflammation and pain.  Put ice in a plastic bag.  Place a towel between your skin and the bag.  Leave the ice on for 15-20 minutes at a time, every 2 hours while you are awake.  Elevate your hand above your heart as directed by your caregiver to reduce swelling.  Take over-the-counter or prescription medicine for pain as your caregiver instructs you. SEEK IMMEDIATE MEDICAL CARE IF:  Your dressing or splint becomes damaged.  Your pain becomes worse rather than better.  You lose feeling in your finger, or it becomes cold and white. MAKE SURE YOU:  Understand these instructions.  Will watch your condition.  Will get help right away if you are not doing well or get worse.   This information is not intended to replace advice given to you by your health care provider. Make sure you discuss any questions you have with your health care provider.   Document Released: 07/20/2000 Document Revised: 08/13/2014 Document Reviewed: 12/17/2014 Elsevier Interactive Patient Education 2016 [place] or Splint Care Casts and splints support injured limbs and keep bones from moving while they heal. It is important to care for your cast or splint at home.  HOME CARE INSTRUCTIONS  Keep the cast or splint uncovered during the drying period. It can take 24 to 48 hours to dry if it is made of plaster. A fiberglass cast will dry in less than 1 hour.  Do not rest the cast on anything harder than a pillow for the first 24 hours.  Do not put weight on your injured limb or apply pressure to the cast until your health care provider gives you permission.  Keep the cast or splint dry. Wet casts or splints can lose their shape and may not support the limb as well. A wet cast that has lost its shape can also create harmful pressure on your skin when it dries. Also, wet skin can become infected.  Cover the cast or splint  with a plastic bag when bathing or when out in the rain or snow. If the cast is on the trunk of the body, take sponge baths until the cast is removed.  If your cast does become wet, dry it with a towel or a blow dryer on the cool setting only.  Keep your cast or splint clean. Soiled casts may be wiped with a moistened cloth.  Do not place any hard or soft foreign objects under your cast or splint, such as cotton, toilet paper, lotion, or powder.  Do not try to scratch the skin under the cast with any object. The object could get stuck inside the cast. Also, scratching could lead to an infection. If itching is a problem, use a blow dryer on a cool setting to relieve discomfort.  Do not trim or cut your cast or remove padding from inside of it.  Exercise all joints next to the injury that are not immobilized by the cast or splint. For example, if you have a long leg cast, exercise the hip joint and toes. If you have an arm cast or splint, exercise the shoulder, elbow, thumb, and fingers.  Elevate your injured arm or leg on 1 or 2 pillows for the first 1 to 3 days to decrease swelling and pain.It is best if you can comfortably elevate your cast so it is higher than your heart. SEEK MEDICAL CARE IF:   Your cast or splint cracks.  Your cast or splint is too tight or too loose.  You have unbearable itching inside the cast.  Your cast becomes wet or develops a soft spot or area.  You have a bad smell coming from inside your cast.  You get an object stuck under your cast.  Your skin around the cast becomes red or raw.  You have new pain or worsening pain after the cast has been applied. SEEK IMMEDIATE MEDICAL CARE IF:   You have fluid leaking through the cast.  You are unable to move your fingers or toes.  You have discolored (blue or white), cool, painful, or very swollen fingers or toes beyond the cast.  You have tingling or numbness around the injured area.  You have severe pain  or pressure under the cast.  You have any difficulty with your breathing or have shortness of breath.  You have chest pain.   This information is not intended to replace advice given to you by your health care provider. Make sure you discuss any questions you have with your health care provider.   Document Released: 07/20/2000 Document Revised: 05/13/2013 Document Reviewed: 01/29/2013 Elsevier Interactive Patient Education [employer].

## 2015-09-23 NOTE — ED Notes (Signed)
Pt here with obvious deformity to middle finger and ring finger on right hand, sts fell on hand.

## 2015-09-23 NOTE — ED Provider Notes (Signed)
CSN: [id number]     Arrival date & time 09/23/15  1052 History  By signing my name below, I, Starleen Arms, attest that this documentation has been prepared under the direction and in the presence of Billiejo Sorto, PA-C. Electronically Signed: Starleen Arms ED Scribe. 09/23/2015. 11:28 AM.    Chief Complaint  Patient presents with  . Finger Injury   Patient is a 60 y.o. male presenting with hand injury. The history is provided by the patient. No language interpreter was used.  Hand Injury Location:  Finger Injury: yes   Mechanism of injury: fall   Fall:    Fall occurred:  Down stairs   Impact surface:  Wall   Point of impact:  Hands Finger location:  R middle finger and R ring finger Pain details:    Radiates to:  Does not radiate   Severity:  Severe   Onset quality:  Sudden   Timing:  Constant   Progression:  Worsening Handedness:  Right-handed Dislocation: yes   Relieved by:  Nothing Worsened by:  Movement Ineffective treatments:  None tried Associated symptoms: decreased range of motion    HPI Comments: Micheal Doyle is a 60 y.o. male who presents to the Emergency Department complaining of constant, moderate-severe right middle and ring finger pain and deformity onset PTA. The patient reports he caught himself from falling down a flight of stairs and in doing so, jammed his right hand into a wall.  He denies numbness/tingling distal to the injury, loss of sensation, skin lacerations or abrasions, headache, neck pain, other extremity pain or any other complaints today.  Past Medical History  Diagnosis Date  . Herpes simplex eyelid dermatitis   . Cluster headache syndrome 1995  . History of basal cell cancer     on back/ over 10 years ago   Past Surgical History  Procedure Laterality Date  . Wisdom tooth extraction     Family History  Problem Relation Age of Onset  . Cancer Neg Hx   . Diabetes Neg Hx   . Alcohol abuse Neg Hx   . Depression Neg Hx   . Drug abuse Neg Hx    . Early death Neg Hx   . Hearing loss Neg Hx   . Heart disease Neg Hx   . Hyperlipidemia Neg Hx   . Hypertension Neg Hx   . Kidney disease Neg Hx   . Learning disabilities Neg Hx   . Stroke Neg Hx    Social History  Substance Use Topics  . Smoking status: Current Every Day Smoker -- 0.50 packs/day for 40 years    Types: Cigarettes  . Smokeless tobacco: Never Used  . Alcohol Use: No    Review of Systems  Musculoskeletal: Positive for arthralgias.  All other systems reviewed and are negative.     Allergies  Review of patient's allergies indicates no known allergies.  Home Medications   Prior to Admission medications   Medication Sig Start Date End Date Taking? Authorizing Provider  eletriptan (RELPAX) 40 MG tablet Take 1 tablet (40 mg total) by mouth as needed for migraine. One tablet by mouth at onset of headache. May repeat in 2 hours if headache persists or recurs. 07/08/14   Janith Lima, MD  HYDROcodone-acetaminophen (NORCO/VICODIN) 5-325 MG tablet Take 1 tablet by mouth every 4 (four) hours as needed. 09/23/15   Aayliah Rotenberry, PA-C  ibuprofen (ADVIL,MOTRIN) 200 MG tablet Take 200 mg by mouth every 6 (six) hours as needed for  pain.    Historical Provider, MD  valACYclovir (VALTREX) 1000 MG tablet Take 1 tablet (1,000 mg total) by mouth 2 (two) times daily. 07/08/14   Janith Lima, MD   BP 120/81 mmHg  Pulse 73  Temp(Src) 98.2 F (36.8 C) (Oral)  Resp 18  SpO2 99% Physical Exam  Constitutional: He is oriented to person, place, and time. He appears well-developed and well-nourished. No distress.  HENT:  Head: Normocephalic and atraumatic.  Eyes: Conjunctivae and EOM are normal.  Neck: Neck supple. No tracheal deviation present.  Cardiovascular: Normal rate and intact distal pulses.   Pulmonary/Chest: Effort normal. No respiratory distress.  Musculoskeletal:       Right hand: He exhibits decreased range of motion, tenderness, deformity and swelling. He exhibits  normal capillary refill and no laceration. Normal sensation noted.       Hands: Obvious deformity of right third and fourth digit. Ulnar deviation of both digits at the PIP joint. Exquisite tenderness over the digits. Mild generalized swelling of the digits. Cap refill less than 3 seconds. Fingertips are warm. Sensation to light touch intact throughout. No laceration, abrasion or other skin breakdown noted over the fingers.  Neurological: He is alert and oriented to person, place, and time.  Skin: Skin is warm and dry.  Psychiatric: He has a normal mood and affect. His behavior is normal.  Nursing note and vitals reviewed.   ED Course  Reduction of dislocation Date/Time: 09/23/2015 1:52 PM Performed by: Josephina Gip Authorized by: Josephina Gip Consent: Verbal consent obtained. Risks and benefits: risks, benefits and alternatives were discussed Consent given by: patient Local anesthesia used: yes Anesthesia: digital block Local anesthetic: bupivacaine 0.25% without epinephrine Anesthetic total: 8 ml Patient sedated: no Patient tolerance: Patient tolerated the procedure well with no immediate complications Comments: Performed reduction of third and fourth digits at the PIP joint of the right hand. Digital block performed prior to reduction. Total anesthetic is 4 mL per finger. Each finger was gently ulnar deviated then reduced with traction and gentle forced flexion of the fingers. Each joint was reduced successfully and without complication.   (including critical care time) DIAGNOSTIC STUDIES: Oxygen Saturation is 97% on RA, normal by my interpretation.    COORDINATION OF CARE:  11:29 AM Discussed plan to perform digital block and obtain imaging. Patient acknowledges and agrees with plan.    Labs Review Labs Reviewed - No data to display  Imaging Review Dg Hand Complete Right  09/23/2015  CLINICAL DATA:  Fall, injury, pain, finger deformities EXAM: RIGHT HAND - COMPLETE 3+  VIEW COMPARISON:  None. FINDINGS: Dorsal dislocation of the right third and fourth PIP joints. No definite associated acute fracture. Mild diffuse osteoarthritic changes of the right first MCP joint and the interphalangeal joints diffusely. Healed fracture with mild angulation of the right fifth metacarpal from a remote boxer's fracture. IMPRESSION: Dorsal dislocation of the right third and fourth PIP joints. Degenerative osteoarthritis Healed right fifth metacarpal boxer's fracture Electronically Signed   By: Jerilynn Mages.  Shick M.D.   On: 09/23/2015 12:50   I have personally reviewed and evaluated these images and lab results as part of my medical decision-making.   EKG Interpretation None      MDM   Final diagnoses:  Dislocation of finger PIP joint, initial encounter   60 year old male presenting with dislocations of the third and fourth digits on the right hand after fall. Obvious deformity to third and fourth digits. Ulnar deviation at the PIP joints without  break in the skin. Fingers are vascularly intact. Hand x-ray shows dislocation of the right third and fourth PIP joints. Dr. Eulis Foster observed reduction of dislocations. Digital block performed on the third and fourth digit with bupivacaine. Fingers reduced with gentle traction and forced flexion. Patient has full range of motion of the digits after reduction. Fingertips are neurovascularly intact. 5/5 grip strength. Patient placed in [place] splint. Patient is to follow up with hand surgeon, Dr. Lenon Curt, in the next few days. Will discharge with short course of Vicodin. Instructed on RICE therapy. Return precautions given in discharge paperwork and discussed with pt at bedside. Pt stable for discharge  I personally performed the services described in this documentation, which was scribed in my presence. The recorded information has been reviewed and is accurate.    Josephina Gip, PA-C 09/23/15 [place], MD 09/23/15 1520

## 2015-09-23 NOTE — Progress Notes (Signed)
Orthopedic Tech Progress Note Patient Details:  Micheal Doyle 1956-02-19 [id number]  Ortho Devices Type of Ortho Device: Ace wrap, [name] splint Ortho Device/Splint Interventions: Application   [place] Pink 09/23/2015, 1:59 PM

## 2016-02-06 ENCOUNTER — Telehealth: Payer: Self-pay | Admitting: Internal Medicine

## 2016-02-06 DIAGNOSIS — Z8619 Personal history of other infectious and parasitic diseases: Secondary | ICD-10-CM

## 2016-02-06 DIAGNOSIS — Z8669 Personal history of other diseases of the nervous system and sense organs: Secondary | ICD-10-CM

## 2016-02-06 MED ORDER — VALACYCLOVIR HCL 1 G PO TABS: 1000.0000 mg | ORAL_TABLET | Freq: Two times a day (BID) | ORAL | Status: DC

## 2016-02-06 NOTE — Telephone Encounter (Signed)
Patient walked in and made an appt. He is asking for a temporary refill of valACYclovir (VALTREX) 1000 MG tablet [id number]  Until his appt next Friday. he is completely out.

## 2016-02-06 NOTE — Telephone Encounter (Signed)
erx sent for temporary fill.

## 2016-02-17 ENCOUNTER — Ambulatory Visit (INDEPENDENT_AMBULATORY_CARE_PROVIDER_SITE_OTHER): Payer: BLUE CROSS/BLUE SHIELD | Admitting: Internal Medicine

## 2016-02-17 ENCOUNTER — Encounter: Payer: Self-pay | Admitting: Internal Medicine

## 2016-02-17 VITALS — BP 130/88 | HR 79 | Temp 98.3°F | Resp 16 | Ht 71.0 in | Wt 182.5 lb

## 2016-02-17 DIAGNOSIS — Z8669 Personal history of other diseases of the nervous system and sense organs: Secondary | ICD-10-CM

## 2016-02-17 DIAGNOSIS — B182 Chronic viral hepatitis C: Secondary | ICD-10-CM

## 2016-02-17 DIAGNOSIS — Z8619 Personal history of other infectious and parasitic diseases: Secondary | ICD-10-CM

## 2016-02-17 MED ORDER — VALACYCLOVIR HCL 1 G PO TABS: 1000.0000 mg | ORAL_TABLET | Freq: Two times a day (BID) | ORAL | Status: DC

## 2016-02-17 NOTE — Patient Instructions (Signed)

## 2016-02-17 NOTE — Progress Notes (Signed)
Pre visit review using our clinic review tool, if applicable. No additional management support is needed unless otherwise documented below in the visit note. 

## 2016-02-17 NOTE — Progress Notes (Signed)
Subjective:  Patient ID: Micheal Doyle, male    DOB: [birth year]  Age: 60 y.o. MRN: [id number]  CC: Rash   HPI ZIAH DEIS presents for a refill on Valtrex. He has recurrent episodes of herpes zoster around his left upper face and left eye. He had his last episode a few weeks ago and called in for a valacyclovir refill. He tells me the rash has resolved and he denies eye pain or redness today. He is also requesting a referral to the hepatitis C clinic for evaluation and treatment. He is not willing to do a physical or any lab work today.  Outpatient Prescriptions Prior to Visit  Medication Sig Dispense Refill  . valACYclovir (VALTREX) 1000 MG tablet Take 1 tablet (1,000 mg total) by mouth 2 (two) times daily. 14 tablet 0  . eletriptan (RELPAX) 40 MG tablet Take 1 tablet (40 mg total) by mouth as needed for migraine. One tablet by mouth at onset of headache. May repeat in 2 hours if headache persists or recurs. 8 tablet 11  . HYDROcodone-acetaminophen (NORCO/VICODIN) 5-325 MG tablet Take 1 tablet by mouth every 4 (four) hours as needed. 18 tablet 0  . ibuprofen (ADVIL,MOTRIN) 200 MG tablet Take 200 mg by mouth every 6 (six) hours as needed for pain. Reported on 02/17/2016     No facility-administered medications prior to visit.    ROS Review of Systems  Constitutional: Negative.  Negative for appetite change and fatigue.  HENT: Negative.   Eyes: Negative.  Negative for photophobia, pain, redness, itching and visual disturbance.  Respiratory: Negative.  Negative for cough and shortness of breath.   Cardiovascular: Negative.  Negative for chest pain and palpitations.  Gastrointestinal: Negative.  Negative for abdominal pain.  Endocrine: Negative.   Genitourinary: Negative.   Musculoskeletal: Negative.  Negative for myalgias, back pain and neck pain.  Skin: Negative.  Negative for color change and rash.  Allergic/Immunologic: Positive for immunocompromised state.  Neurological:  Negative.   Hematological: Negative.  Negative for adenopathy. Does not bruise/bleed easily.    Objective:  BP 130/88 mmHg  Pulse 79  Temp(Src) 98.3 F (36.8 C) (Oral)  Ht 5\' 11"  (1.803 m)  Wt 182 lb 8 oz (82.781 kg)  BMI 25.46 kg/m2  SpO2 97%  BP Readings from Last 3 Encounters:  02/17/16 130/88  09/23/15 120/81  07/08/14 120/74    Wt Readings from Last 3 Encounters:  02/17/16 182 lb 8 oz (82.781 kg)  07/08/14 187 lb (84.823 kg)  06/17/13 196 lb (88.905 kg)    Physical Exam  Constitutional: He is oriented to person, place, and time. No distress.  HENT:  Mouth/Throat: Oropharynx is clear and moist. No oropharyngeal exudate.  Eyes: Conjunctivae are normal. Right eye exhibits no discharge. Left eye exhibits no discharge. No scleral icterus.  Neck: Normal range of motion. Neck supple. No JVD present. No tracheal deviation present. No thyromegaly present.  Cardiovascular: Normal rate, normal heart sounds and intact distal pulses.  Exam reveals no gallop and no friction rub.   No murmur heard. Pulmonary/Chest: Effort normal and breath sounds normal. No stridor. No respiratory distress. He has no wheezes. He has no rales. He exhibits no tenderness.  Abdominal: Soft. Bowel sounds are normal. He exhibits no distension and no mass. There is no tenderness. There is no rebound and no guarding.  Musculoskeletal: Normal range of motion. He exhibits no edema or tenderness.  Lymphadenopathy:    He has no cervical adenopathy.  Neurological: He  is oriented to person, place, and time.  Skin: Skin is warm and dry. No rash noted. He is not diaphoretic. No erythema. No pallor.  Vitals reviewed.   Lab Results  Component Value Date   WBC 5.2 02/27/2013   HGB 17.1* 02/27/2013   HCT 49.3 02/27/2013   PLT 252.0 02/27/2013   GLUCOSE 84 02/27/2013   CHOL 179 02/27/2013   TRIG 202.0* 02/27/2013   HDL 33.50* 02/27/2013   LDLDIRECT 111.5 02/27/2013   ALT 57* 02/27/2013   AST 41* 02/27/2013     NA 138 02/27/2013   K 4.4 02/27/2013   CL 105 02/27/2013   CREATININE 0.8 02/27/2013   BUN 15 02/27/2013   CO2 25 02/27/2013   TSH 2.15 02/27/2013   PSA 0.22 02/27/2013   INR 1.0 03/16/2013    Dg Hand Complete Right  09/23/2015  CLINICAL DATA:  Fall, injury, pain, finger deformities EXAM: RIGHT HAND - COMPLETE 3+ VIEW COMPARISON:  None. FINDINGS: Dorsal dislocation of the right third and fourth PIP joints. No definite associated acute fracture. Mild diffuse osteoarthritic changes of the right first MCP joint and the interphalangeal joints diffusely. Healed fracture with mild angulation of the right fifth metacarpal from a remote boxer's fracture. IMPRESSION: Dorsal dislocation of the right third and fourth PIP joints. Degenerative osteoarthritis Healed right fifth metacarpal boxer's fracture Electronically Signed   By: Jerilynn Mages.  Shick M.D.   On: 09/23/2015 12:50    Assessment & Plan:   Eber was seen today for rash.  Diagnoses and all orders for this visit:  H/O herpes zoster keratoconjunctivitis- his most recent episode of herpes infection has resolved, he will keep valacyclovir on hand for any recurrences. -     valACYclovir (VALTREX) 1000 MG tablet; Take 1 tablet (1,000 mg total) by mouth 2 (two) times daily.  Chronic hepatitis C without hepatic coma (HCC) -     AMB referral to hepatitis C clinic  I have discontinued Mr. Starkes's ibuprofen, eletriptan, and HYDROcodone-acetaminophen. I am also having him maintain his valACYclovir.  Meds ordered this encounter  Medications  . valACYclovir (VALTREX) 1000 MG tablet    Sig: Take 1 tablet (1,000 mg total) by mouth 2 (two) times daily.    Dispense:  14 tablet    Refill:  1     Follow-up: Return in about 4 weeks (around 03/16/2016).  Scarlette Calico, MD

## 2016-02-19 ENCOUNTER — Encounter: Payer: Self-pay | Admitting: Internal Medicine

## 2016-03-14 ENCOUNTER — Encounter: Payer: Self-pay | Admitting: *Deleted

## 2016-03-26 ENCOUNTER — Encounter: Payer: Self-pay | Admitting: Gastroenterology

## 2016-04-02 ENCOUNTER — Encounter: Payer: Self-pay | Admitting: Internal Medicine

## 2016-04-02 ENCOUNTER — Ambulatory Visit (INDEPENDENT_AMBULATORY_CARE_PROVIDER_SITE_OTHER): Payer: BLUE CROSS/BLUE SHIELD | Admitting: Internal Medicine

## 2016-04-02 VITALS — BP 116/78 | HR 66 | Temp 97.6°F | Resp 16 | Ht 71.0 in | Wt 184.5 lb

## 2016-04-02 DIAGNOSIS — D2260 Melanocytic nevi of unspecified upper limb, including shoulder: Secondary | ICD-10-CM

## 2016-04-02 DIAGNOSIS — C4491 Basal cell carcinoma of skin, unspecified: Secondary | ICD-10-CM

## 2016-04-02 DIAGNOSIS — Z23 Encounter for immunization: Secondary | ICD-10-CM | POA: Diagnosis not present

## 2016-04-02 DIAGNOSIS — Z0000 Encounter for general adult medical examination without abnormal findings: Secondary | ICD-10-CM | POA: Diagnosis not present

## 2016-04-02 DIAGNOSIS — D235 Other benign neoplasm of skin of trunk: Secondary | ICD-10-CM

## 2016-04-02 LAB — FECAL OCCULT BLOOD, GUAIAC: FECAL OCCULT BLD: NEGATIVE

## 2016-04-02 NOTE — Progress Notes (Signed)
Subjective:  Patient ID: Micheal Doyle, male    DOB: [birth year]  Age: 60 y.o. MRN: [id number]  CC: Annual Exam   HPI [name] presents for a CPX.  He tells me that he has an appointment soon with a hepatitis C doctor. He also tells me that he has a history of basal cell carcinoma and had a lesion removed somewhere on his body about 10 years ago. He feels well today and offers no complaints.  Outpatient Medications Prior to Visit  Medication Sig Dispense Refill  . valACYclovir (VALTREX) 1000 MG tablet Take 1 tablet (1,000 mg total) by mouth 2 (two) times daily. 14 tablet 1   No facility-administered medications prior to visit.     ROS Review of Systems  Constitutional: Negative.  Negative for appetite change, diaphoresis and unexpected weight change.  HENT: Negative.   Eyes: Negative.  Negative for visual disturbance.  Respiratory: Negative for cough, choking, chest tightness, shortness of breath, wheezing and stridor.   Cardiovascular: Negative.  Negative for chest pain, palpitations and leg swelling.  Gastrointestinal: Negative.  Negative for abdominal pain, blood in stool, constipation, diarrhea, nausea and vomiting.  Endocrine: Negative.   Genitourinary: Negative.  Negative for difficulty urinating, discharge, dysuria, flank pain, frequency, scrotal swelling, testicular pain and urgency.  Musculoskeletal: Negative.  Negative for arthralgias, back pain, myalgias and neck pain.  Skin: Positive for color change. Negative for rash and wound.  Allergic/Immunologic: Negative.   Neurological: Negative.  Negative for dizziness, tremors, weakness, light-headedness, numbness and headaches.  Hematological: Negative.  Negative for adenopathy. Does not bruise/bleed easily.  Psychiatric/Behavioral: Negative.     Objective:  BP 116/78 (BP Location: Left Arm, Patient Position: Sitting, Cuff Size: Normal)   Pulse 66   Temp 97.6 F (36.4 C) (Oral)   Resp 16   Ht 5\' 11"  (1.803 m)    Wt 184 lb 8 oz (83.7 kg)   SpO2 94%   BMI 25.73 kg/m   BP Readings from Last 3 Encounters:  04/02/16 116/78  02/17/16 130/88  09/23/15 120/81    Wt Readings from Last 3 Encounters:  04/02/16 184 lb 8 oz (83.7 kg)  02/17/16 182 lb 8 oz (82.8 kg)  07/08/14 187 lb (84.8 kg)    Physical Exam  Constitutional: He is oriented to person, place, and time. No distress.  HENT:  Mouth/Throat: Oropharynx is clear and moist.  Eyes: Conjunctivae are normal. Right eye exhibits no discharge. No scleral icterus.  Neck: Normal range of motion. Neck supple. No JVD present. No tracheal deviation present. No thyromegaly present.  Cardiovascular: Normal rate, regular rhythm, normal heart sounds and intact distal pulses.  Exam reveals no gallop and no friction rub.   No murmur heard. Pulmonary/Chest: Effort normal and breath sounds normal. No stridor. No respiratory distress. He has no wheezes. He has no rales. He exhibits no tenderness.  Abdominal: Soft. Bowel sounds are normal. He exhibits no distension and no mass. There is no tenderness. There is no rebound and no guarding. Hernia confirmed negative in the right inguinal area and confirmed negative in the left inguinal area.  Genitourinary: Testes normal and penis normal. Rectal exam shows no external hemorrhoid, no internal hemorrhoid, no fissure, no mass, no tenderness, anal tone normal and guaiac negative stool. Prostate is enlarged (1+ smooth symm BPH). Prostate is not tender. Right testis shows no mass, no swelling and no tenderness. Right testis is descended. Left testis shows no mass, no swelling and no tenderness. Left  testis is descended. Circumcised. No penile erythema or penile tenderness. No discharge found.  Musculoskeletal: Normal range of motion. He exhibits no edema, tenderness or deformity.  Lymphadenopathy:    He has no cervical adenopathy.       Right: No inguinal adenopathy present.       Left: No inguinal adenopathy present.    Neurological: He is oriented to person, place, and time.  Skin: Skin is warm and dry. No rash noted. He is not diaphoretic. No erythema. No pallor.     Psychiatric: He has a normal mood and affect. His behavior is normal. Judgment and thought content normal.  Vitals reviewed.   Lab Results  Component Value Date   WBC 5.2 02/27/2013   HGB 17.1 (H) 02/27/2013   HCT 49.3 02/27/2013   PLT 252.0 02/27/2013   GLUCOSE 84 02/27/2013   CHOL 179 02/27/2013   TRIG 202.0 (H) 02/27/2013   HDL 33.50 (L) 02/27/2013   LDLDIRECT 111.5 02/27/2013   ALT 57 (H) 02/27/2013   AST 41 (H) 02/27/2013   NA 138 02/27/2013   K 4.4 02/27/2013   CL 105 02/27/2013   CREATININE 0.8 02/27/2013   BUN 15 02/27/2013   CO2 25 02/27/2013   TSH 2.15 02/27/2013   PSA 0.22 02/27/2013   INR 1.0 03/16/2013    Dg Hand Complete Right  Result Date: 09/23/2015 CLINICAL DATA:  Fall, injury, pain, finger deformities EXAM: RIGHT HAND - COMPLETE 3+ VIEW COMPARISON:  None. FINDINGS: Dorsal dislocation of the right third and fourth PIP joints. No definite associated acute fracture. Mild diffuse osteoarthritic changes of the right first MCP joint and the interphalangeal joints diffusely. Healed fracture with mild angulation of the right fifth metacarpal from a remote boxer's fracture. IMPRESSION: Dorsal dislocation of the right third and fourth PIP joints. Degenerative osteoarthritis Healed right fifth metacarpal boxer's fracture Electronically Signed   By: Jerilynn Mages.  Shick M.D.   On: 09/23/2015 12:50    Assessment & Plan:   Amie was seen today for annual exam.  Diagnoses and all orders for this visit:  Routine general medical examination at a health care facility- exam completed, labs reviewed, vaccines were reviewed, colonoscopy is UTD, pt ed material was given -     Lipid panel; Future -     Comprehensive metabolic panel; Future -     CBC with Differential/Platelet; Future -     PSA; Future  BCC (basal cell carcinoma of  skin) -     Ambulatory referral to Dermatology  Atypical nevus of clavicle -     Ambulatory referral to Dermatology  Need for prophylactic vaccination and inoculation against influenza -     Flu Vaccine QUAD 36+ mos IM   I am having Mr. Gildea maintain his valACYclovir.  No orders of the defined types were placed in this encounter.    Follow-up: Return if symptoms worsen or fail to improve.  Scarlette Calico, MD

## 2016-04-02 NOTE — Patient Instructions (Signed)

## 2016-04-02 NOTE — Progress Notes (Signed)
Pre visit review using our clinic review tool, if applicable. No additional management support is needed unless otherwise documented below in the visit note. 

## 2016-04-11 ENCOUNTER — Other Ambulatory Visit: Payer: BLUE CROSS/BLUE SHIELD

## 2016-04-11 DIAGNOSIS — B182 Chronic viral hepatitis C: Secondary | ICD-10-CM

## 2016-04-11 LAB — CBC WITH DIFFERENTIAL/PLATELET
Basophils Absolute: 44 cells/uL (ref 0–200)
Basophils Relative: 1 %
EOS PCT: 2 %
Eosinophils Absolute: 88 cells/uL (ref 15–500)
HCT: 42.5 % (ref 38.5–50.0)
Hemoglobin: 14.8 g/dL (ref 13.2–17.1)
LYMPHS ABS: 1892 {cells}/uL (ref 850–3900)
LYMPHS PCT: 43 %
MCH: 34.9 pg — ABNORMAL HIGH (ref 27.0–33.0)
MCHC: 34.8 g/dL (ref 32.0–36.0)
MCV: 100.2 fL — ABNORMAL HIGH (ref 80.0–100.0)
MPV: 10.5 fL (ref 7.5–12.5)
Monocytes Absolute: 264 cells/uL (ref 200–950)
Monocytes Relative: 6 %
NEUTROS PCT: 48 %
Neutro Abs: 2112 cells/uL (ref 1500–7800)
PLATELETS: 221 10*3/uL (ref 140–400)
RBC: 4.24 MIL/uL (ref 4.20–5.80)
RDW: 13.1 % (ref 11.0–15.0)
WBC: 4.4 10*3/uL (ref 3.8–10.8)

## 2016-04-12 LAB — COMPREHENSIVE METABOLIC PANEL
ALT: 89 U/L — ABNORMAL HIGH (ref 9–46)
AST: 35 U/L (ref 10–35)
Albumin: 4.2 g/dL (ref 3.6–5.1)
Alkaline Phosphatase: 51 U/L (ref 40–115)
BILIRUBIN TOTAL: 0.8 mg/dL (ref 0.2–1.2)
BUN: 14 mg/dL (ref 7–25)
CO2: 24 mmol/L (ref 20–31)
CREATININE: 0.93 mg/dL (ref 0.70–1.25)
Calcium: 9.3 mg/dL (ref 8.6–10.3)
Chloride: 105 mmol/L (ref 98–110)
GLUCOSE: 117 mg/dL — AB (ref 65–99)
Potassium: 4.2 mmol/L (ref 3.5–5.3)
SODIUM: 140 mmol/L (ref 135–146)
Total Protein: 7.2 g/dL (ref 6.1–8.1)

## 2016-04-12 LAB — PROTIME-INR
INR: 1
Prothrombin Time: 11.1 s (ref 9.0–11.5)

## 2016-04-12 LAB — HEPATITIS B SURFACE ANTIGEN: Hepatitis B Surface Ag: NEGATIVE

## 2016-04-12 LAB — AFP TUMOR MARKER: AFP-Tumor Marker: 8.1 ng/mL — ABNORMAL HIGH (ref <6.1)

## 2016-04-12 LAB — HEPATITIS A ANTIBODY, TOTAL: Hep A Total Ab: REACTIVE — AB

## 2016-04-12 LAB — HIV ANTIBODY (ROUTINE TESTING W REFLEX): HIV 1&2 Ab, 4th Generation: NONREACTIVE

## 2016-04-12 LAB — HEPATITIS B SURFACE ANTIBODY,QUALITATIVE: Hep B S Ab: NEGATIVE

## 2016-04-12 LAB — HEPATITIS B CORE ANTIBODY, TOTAL: Hep B Core Total Ab: REACTIVE — AB

## 2016-04-16 LAB — HCV RNA, QUANT REAL-TIME PCR W/REFLEX
HCV RNA, PCR, QN (Log): 6.05 LogIU/mL — ABNORMAL HIGH
HCV RNA, PCR, QN: 1110000 IU/mL — ABNORMAL HIGH

## 2016-04-16 LAB — HCV RNA,LIPA RFLX NS5A DRUG RESIST

## 2016-04-18 LAB — HCV RNA NS5A DRUG RESISTANCE

## 2016-04-19 LAB — LIVER FIBROSIS, FIBROTEST-ACTITEST

## 2016-05-16 ENCOUNTER — Encounter: Payer: BLUE CROSS/BLUE SHIELD | Admitting: Internal Medicine

## 2016-06-05 ENCOUNTER — Encounter: Payer: Self-pay | Admitting: Internal Medicine

## 2016-06-05 ENCOUNTER — Ambulatory Visit (INDEPENDENT_AMBULATORY_CARE_PROVIDER_SITE_OTHER): Payer: BLUE CROSS/BLUE SHIELD | Admitting: Internal Medicine

## 2016-06-05 VITALS — BP 121/77 | HR 88 | Temp 98.3°F | Ht 70.0 in | Wt 188.0 lb

## 2016-06-05 DIAGNOSIS — B182 Chronic viral hepatitis C: Secondary | ICD-10-CM | POA: Diagnosis not present

## 2016-06-05 DIAGNOSIS — Z23 Encounter for immunization: Secondary | ICD-10-CM

## 2016-06-05 MED ORDER — LEDIPASVIR-SOFOSBUVIR 90-400 MG PO TABS: 1.0000 | ORAL_TABLET | Freq: Every day | ORAL | 2 refills | Status: DC

## 2016-06-05 NOTE — Patient Instructions (Signed)
Date 06/05/16  Dear Micheal Doyle, As discussed in the [facility], your hepatitis C therapy will include the following medications:          Harvoni 90mg /400mg  tablet:           Take 1 tablet by mouth once daily   Please note that ALL MEDICATIONS WILL START ON THE SAME DATE for a total of 12 weeks. ---------------------------------------------------------------- Your HCV Treatment Start Date: TBA   Your HCV genotype:  1a    Liver Fibrosis: TBD    ---------------------------------------------------------------- YOUR PHARMACY CONTACT:   [place] [facility] [place] Phone: [phone] Hours: Monday to Friday 7:30 am to 6:00 pm   Please always contact your pharmacy at least 3-4 business days before you run out of medications to ensure your next month's medication is ready or 1 week prior to running out if you receive it by mail.  Remember, each prescription is for 28 days. ---------------------------------------------------------------- GENERAL NOTES REGARDING YOUR HEPATITIS C MEDICATION:  SOFOSBUVIR/LEDIPASVIR (HARVONI): - Harvoni tablet is taken daily with OR without food. - The tablets are orange. - The tablets should be stored at room temperature.  - Acid reducing agents such as H2 blockers (ie. Pepcid (famotidine), Zantac (ranitidine), Tagamet (cimetidine), Axid (nizatidine) and proton pump inhibitors (ie. Prilosec (omeprazole), Protonix (pantoprazole), Nexium (esomeprazole), or Aciphex (rabeprazole)) can decrease effectiveness of Harvoni. Do not take until you have discussed with a health care provider.    -Antacids that contain magnesium and/or aluminum hydroxide (ie. Milk of Magensia, Rolaids, Gaviscon, Maalox, Mylanta, an dArthritis Pain Formula)can reduce absorption of Harvoni, so take them at least 4 hours before or after Harvoni.  -Calcium carbonate (calcium supplements or antacids such as Tums, Caltrate,  Os-Cal)needs to be taken at least 4 hours hours before or after Harvoni.  -St. Jacksen's wort or any products that contain St. Tagen's wort like some herbal supplements  Please inform the office prior to starting any of these medications.  - The common side effects associated with Harvoni include:      1. Fatigue      2. Headache      3. Nausea      4. Diarrhea      5. Insomnia  Please note that this only lists the most common side effects and is NOT a comprehensive list of the potential side effects of these medications. For more information, please review the drug information sheets that come with your medication package from the pharmacy.  ---------------------------------------------------------------- GENERAL HELPFUL HINTS ON HCV THERAPY: 1. Stay well-hydrated. 2. Notify the [facility] of any changes in your other over-the-counter/herbal or prescription medications. 3. If you miss a dose of your medication, take the missed dose as soon as you remember. Return to your regular time/dose schedule the next day.  4.  Do not stop taking your medications without first talking with your healthcare provider. 5.  You may take Tylenol (acetaminophen), as long as the dose is less than 2000 mg (OR no more than 4 tablets of the Tylenol Extra Strengths 500mg  tablet) in 24 hours. 6.  You will see our pharmacist-specialist within the first 2 weeks of starting your medication to monitor for any possible side effects. 7.  You will have labs once during treatment, after soon after treatment completion and one final lab 6 months after treatment completion to verify the virus is out of your system.  Scharlene Gloss, [place] [facility]  [facility] [address] [place], [place]  [address] [phone]

## 2016-06-05 NOTE — Addendum Note (Signed)
Addended by: Myrtis Hopping A on: 06/05/2016 02:36 PM   Modules accepted: Orders

## 2016-06-05 NOTE — Progress Notes (Signed)
[facility]   CC: consideration for treatment for chronic hepatitis C  HPI:  +Micheal Doyle is a 60 y.o. male who presents for initial evaluation and management of chronic hepatitis C.  Patient tested positive many years ago. Hepatitis C-associated risk factors present are: IV drug abuse (details: over 30 years ago). Patient denies renal dialysis, sexual contact with person with liver disease. Patient has had other studies performed. Results: hepatitis C RNA by PCR, result: positive. Patient has not had prior treatment for Hepatitis C. Patient does not have a past history of liver disease. Patient does not have a family history of liver disease. Patient does not  have associated signs or symptoms related to liver disease.  Labs reviewed and confirm chronic hepatitis C with a positive viral load.   Records reviewed from PCP and only medication is Valtrex occasionally, no ppi or anti acid.        Patient does have documented immunity to Hepatitis A. Patient does not have documented immunity to Hepatitis B.    Review of Systems:   Constitutional: negative for anorexia and weight loss Gastrointestinal: negative for diarrhea Musculoskeletal: negative for myalgias and arthralgias All other systems reviewed and are negative      Past Medical History:  Diagnosis Date  . Cluster headache syndrome 1995  . Herpes simplex eyelid dermatitis   . History of basal cell cancer    on back/ over 10 years ago    Prior to Admission medications   Medication Sig Start Date End Date Taking? Authorizing Provider  valACYclovir (VALTREX) 1000 MG tablet Take 1 tablet (1,000 mg total) by mouth 2 (two) times daily. 02/17/16  Yes Janith Lima, MD  Ledipasvir-Sofosbuvir (HARVONI) 90-400 MG TABS Take 1 tablet by mouth daily. 06/05/16   Thayer Headings, MD    No Known Allergies  Social History  Substance Use Topics  . Smoking status: Former Smoker    Packs/day: 0.50    Years:  40.00    Types: Cigarettes  . Smokeless tobacco: Never Used  . Alcohol use No    Family History  Problem Relation Age of Onset  . Cancer Neg Hx   . Diabetes Neg Hx   . Alcohol abuse Neg Hx   . Depression Neg Hx   . Drug abuse Neg Hx   . Early death Neg Hx   . Hearing loss Neg Hx   . Heart disease Neg Hx   . Hyperlipidemia Neg Hx   . Hypertension Neg Hx   . Kidney disease Neg Hx   . Learning disabilities Neg Hx   . Stroke Neg Hx   Liver cancer in 2nd degree relative   Objective:  Constitutional: in no apparent distress and alert,  Vitals:   06/05/16 1036  BP: 121/77  Pulse: 88  Temp: 98.3 F (36.8 C)   Eyes: anicteric Cardiovascular: Cor RRR Respiratory: CTA B; normal respiratory effort Gastrointestinal: Bowel sounds are normal, liver is not enlarged, spleen is not enlarged Musculoskeletal: no pedal edema noted Skin: negatives: no rash; no porphyria cutanea tarda Lymphatic: no cervical lymphadenopathy   Laboratory Genotype: No results found for: HCVGENOTYPE HCV viral load:  Lab Results  Component Value Date   HCVQUANT [id number] (H) 03/16/2013   Lab Results  Component Value Date   WBC 4.4 04/11/2016   HGB 14.8 04/11/2016   HCT 42.5 04/11/2016   MCV 100.2 (H) 04/11/2016   PLT 221 04/11/2016    Lab Results  Component Value Date   CREATININE 0.93 04/11/2016   BUN 14 04/11/2016   NA 140 04/11/2016   K 4.2 04/11/2016   CL 105 04/11/2016   CO2 24 04/11/2016    Lab Results  Component Value Date   ALT 89 (H) 04/11/2016   AST 35 04/11/2016   ALKPHOS 51 04/11/2016     Labs and history reviewed and show CHILD-PUGH A  5-6 points: Child class A 7-9 points: Child class B 10-15 points: Child class C  Lab Results  Component Value Date   INR 1.0 04/11/2016   BILITOT 0.8 04/11/2016   ALBUMIN 4.2 04/11/2016     Assessment: New Patient with Chronic Hepatitis C genotype 1a, untreated.  I discussed with the patient the lab findings that confirm chronic  hepatitis C as well as the natural history and progression of disease including about 30% of people who develop cirrhosis of the liver if left untreated and once cirrhosis is established there is a 2-7% risk per year of liver cancer and liver failure.  I discussed the importance of treatment and benefits in reducing the risk, even if significant liver fibrosis exists.   Plan: 1) Patient counseled extensively on limiting acetaminophen to no more than 2 grams daily, avoidance of alcohol. 2) Transmission discussed with patient including sexual transmission, sharing razors and toothbrush.   3) Will need referral to gastroenterology if concern for cirrhosis 4) Will need referral for substance abuse counseling: No.; Further work up to include urine drug screen  No. 5) Will prescribe Harvoni for 12 weeks 6) Hepatitis A vaccine No. 7) Hepatitis B vaccine Yes.   8) Pneumovax vaccine if concern for cirrhosis 9) Further work up to include liver staging with elastography 10) will follow up after starting medication

## 2016-07-09 ENCOUNTER — Ambulatory Visit (INDEPENDENT_AMBULATORY_CARE_PROVIDER_SITE_OTHER): Payer: BLUE CROSS/BLUE SHIELD | Admitting: *Deleted

## 2016-07-09 DIAGNOSIS — B182 Chronic viral hepatitis C: Secondary | ICD-10-CM

## 2016-07-09 DIAGNOSIS — Z23 Encounter for immunization: Secondary | ICD-10-CM

## 2016-07-17 ENCOUNTER — Ambulatory Visit (HOSPITAL_COMMUNITY)
Admission: RE | Admit: 2016-07-17 | Discharge: 2016-07-17 | Disposition: A | Discharge status: Home / Self Care | Payer: BLUE CROSS/BLUE SHIELD | Source: Ambulatory Visit | Attending: Internal Medicine | Admitting: Internal Medicine

## 2016-07-17 DIAGNOSIS — K7689 Other specified diseases of liver: Secondary | ICD-10-CM | POA: Insufficient documentation

## 2016-07-17 DIAGNOSIS — B182 Chronic viral hepatitis C: Secondary | ICD-10-CM | POA: Diagnosis present

## 2016-08-03 ENCOUNTER — Telehealth: Payer: Self-pay | Admitting: *Deleted

## 2016-08-03 NOTE — Telephone Encounter (Signed)
Patient has not started Harvoni.  He is worried about starting Harvoni when he is going to be having "dental work" done.  M. Devin Going, [facility] pharmacist called the patient and urged him to start Harvoni once he starts the "dental work."

## 2016-08-10 ENCOUNTER — Encounter: Payer: Self-pay | Admitting: Pharmacy Technician

## 2016-11-05 ENCOUNTER — Ambulatory Visit (INDEPENDENT_AMBULATORY_CARE_PROVIDER_SITE_OTHER): Payer: BLUE CROSS/BLUE SHIELD | Admitting: Pharmacist

## 2016-11-05 DIAGNOSIS — B182 Chronic viral hepatitis C: Secondary | ICD-10-CM | POA: Diagnosis not present

## 2016-11-05 NOTE — Progress Notes (Signed)
HPI: Micheal Doyle is a 61 y.o. male who presents to the [facility] today for EOT follow-up of his Hep C infection.  He has genotype 1a, F2/F3, and finished 8 weeks of Harvoni at the beginning of March.  No results found for: HCVGENOTYPE, HEPCGENOTYPE  Allergies: No Known Allergies  Past Medical History: Past Medical History:  Diagnosis Date  . Cluster headache syndrome 1995  . Herpes simplex eyelid dermatitis   . History of basal cell cancer    on back/ over 10 years ago    Social History: Social History   Social History  . Marital status: Divorced    Spouse name: N/A  . Number of children: N/A  . Years of education: N/A   Social History Main Topics  . Smoking status: Former Smoker    Packs/day: 0.50    Years: 40.00    Types: Cigarettes  . Smokeless tobacco: Never Used  . Alcohol use No  . Drug use: Yes    Types: Marijuana  . Sexual activity: Yes    Birth control/ protection: None   Other Topics Concern  . Not on file   Social History Narrative  . No narrative on file    Labs: Hep B S Ab (no units)  Date Value  04/11/2016 NEG   Hepatitis B Surface Ag (no units)  Date Value  04/11/2016 NEGATIVE   HCV Ab (no units)  Date Value  02/27/2013 REACTIVE (A)    No results found for: HCVGENOTYPE, HEPCGENOTYPE  Hepatitis C RNA quantitative Latest Ref Rng & Units 03/16/2013  HCV Quantitative <15 IU/mL 9,629,528(U)  HCV Quantitative Log <1.18 log 10 6.53(H)    AST (U/L)  Date Value  04/11/2016 35  02/27/2013 41 (H)   ALT  Date Value  04/11/2016 89 U/L (H)  04/11/2016 CANCELED  02/27/2013 57 U/L (H)   INR  Date Value  04/11/2016 1.0  03/16/2013 1.0 ratio    CrCl: CrCl cannot be calculated (Patient's most recent lab result is older than the maximum 21 days allowed.).  Fibrosis Score: F2/F3 as assessed by elastography   Child-Pugh Score: A  Previous Treatment Regimen: None  Assessment: Micheal Doyle is here today for Hep C follow-up.  He  finished 8 weeks of Harvoni around the beginning of March.  His [employer] only approved him for 8 weeks since his baseline Hep C viral load was <6 million, he is not [place] American, and he does not have cirrhosis. He did not follow-up after starting therapy, so we do not have an early viral load on treatment.  He states he missed 3 days of medications during the 8 weeks.  He says he was really tired during treatment that is only just starting to resolve. I told him we will hope he is cured even though he missed several doses.  I will get an EOT VL and make his cure visits. He also needs his 3rd Hep B vaccine in May.    Plans: - EOT Hep C viral load today (a little late) - 3rd Hep B vaccine appointment with pharmacy 5/2 at [facility] lab appointment 6/12 at [facility] appointment 6/19 at 3:30pm  Nolia Tschantz L. Kiona Blume, PharmD, [facility] 11/05/2016, 3:53 PM

## 2016-11-06 LAB — HEPATITIS C RNA QUANTITATIVE
HCV Quantitative Log: <1.18 Log IU/mL
HCV Quantitative: <15 IU/mL

## 2016-11-09 ENCOUNTER — Other Ambulatory Visit: Payer: Self-pay | Admitting: Pharmacist

## 2016-12-05 ENCOUNTER — Ambulatory Visit (INDEPENDENT_AMBULATORY_CARE_PROVIDER_SITE_OTHER): Payer: BLUE CROSS/BLUE SHIELD | Admitting: Pharmacist

## 2016-12-05 DIAGNOSIS — B182 Chronic viral hepatitis C: Secondary | ICD-10-CM | POA: Diagnosis not present

## 2016-12-05 DIAGNOSIS — Z23 Encounter for immunization: Secondary | ICD-10-CM | POA: Diagnosis not present

## 2016-12-05 NOTE — Progress Notes (Signed)
HPI: Micheal Doyle is a 61 y.o. male who presents to the [facility] clinic for his 3rd Hep B shot.  He finished treatment for his Hep C at the beginning of March and had an undetectable viral load at end of therapy.   No results found for: HCVGENOTYPE, HEPCGENOTYPE  Allergies: No Known Allergies  Past Medical History: Past Medical History:  Diagnosis Date  . Cluster headache syndrome 1995  . Herpes simplex eyelid dermatitis   . History of basal cell cancer    on back/ over 10 years ago    Social History: Social History   Social History  . Marital status: Divorced    Spouse name: N/A  . Number of children: N/A  . Years of education: N/A   Social History Main Topics  . Smoking status: Former Smoker    Packs/day: 0.50    Years: 40.00    Types: Cigarettes  . Smokeless tobacco: Never Used  . Alcohol use No  . Drug use: Yes    Types: Marijuana  . Sexual activity: Yes    Birth control/ protection: None   Other Topics Concern  . Not on file   Social History Narrative  . No narrative on file    Labs: Hep B S Ab (no units)  Date Value  04/11/2016 NEG   Hepatitis B Surface Ag (no units)  Date Value  04/11/2016 NEGATIVE   HCV Ab (no units)  Date Value  02/27/2013 REACTIVE (A)    No results found for: HCVGENOTYPE, HEPCGENOTYPE  Hepatitis C RNA quantitative Latest Ref Rng & Units 11/05/2016 03/16/2013  HCV Quantitative NOT DETECTED IU/mL <15 NOT DETECTED 0,160,109(N)  HCV Quantitative Log NOT DETECTED Log IU/mL <1.18 NOT DETECTED 6.53(H)    AST (U/L)  Date Value  04/11/2016 35  02/27/2013 41 (H)   ALT  Date Value  04/11/2016 89 U/L (H)  04/11/2016 CANCELED  02/27/2013 57 U/L (H)   INR  Date Value  04/11/2016 1.0  03/16/2013 1.0 ratio    CrCl: CrCl cannot be calculated (Patient's most recent lab result is older than the maximum 21 days allowed.).  Fibrosis Score: F2/F3 as assessed by elastography   Child-Pugh Score: A  Previous Treatment  Regimen: None  Assessment: [name] is here today for his 3rd Hep B shot.  He is done with his 8 weeks of Harvoni and was undetectable at his EOT visit.  He will come back to see me in June for his cure visit.    Plans: - 3rd Hep B vaccine today - SVR12 lab visit 6/12 at [facility] visit 6/19 at 3:30pm  Chae Shuster L. Yolander Goodie, PharmD, [facility] 12/05/2016, 3:33 PM

## 2017-01-15 ENCOUNTER — Other Ambulatory Visit: Payer: BLUE CROSS/BLUE SHIELD

## 2017-01-22 ENCOUNTER — Ambulatory Visit: Payer: BLUE CROSS/BLUE SHIELD

## 2018-01-07 ENCOUNTER — Ambulatory Visit: Payer: BLUE CROSS/BLUE SHIELD | Admitting: Internal Medicine

## 2018-01-07 DIAGNOSIS — Z0289 Encounter for other administrative examinations: Secondary | ICD-10-CM

## 2018-02-03 ENCOUNTER — Encounter: Payer: BLUE CROSS/BLUE SHIELD | Admitting: Internal Medicine

## 2018-02-26 ENCOUNTER — Encounter: Payer: Self-pay | Admitting: Neurology

## 2018-02-26 ENCOUNTER — Encounter: Payer: Self-pay | Admitting: Internal Medicine

## 2018-02-26 ENCOUNTER — Other Ambulatory Visit (INDEPENDENT_AMBULATORY_CARE_PROVIDER_SITE_OTHER): Payer: BLUE CROSS/BLUE SHIELD

## 2018-02-26 ENCOUNTER — Ambulatory Visit: Payer: BLUE CROSS/BLUE SHIELD | Admitting: Internal Medicine

## 2018-02-26 VITALS — BP 112/80 | HR 82 | Ht 70.0 in | Wt 183.0 lb

## 2018-02-26 DIAGNOSIS — Z0000 Encounter for general adult medical examination without abnormal findings: Secondary | ICD-10-CM

## 2018-02-26 DIAGNOSIS — B182 Chronic viral hepatitis C: Secondary | ICD-10-CM | POA: Diagnosis not present

## 2018-02-26 DIAGNOSIS — N5201 Erectile dysfunction due to arterial insufficiency: Secondary | ICD-10-CM | POA: Diagnosis not present

## 2018-02-26 DIAGNOSIS — Z8619 Personal history of other infectious and parasitic diseases: Secondary | ICD-10-CM

## 2018-02-26 DIAGNOSIS — Z8669 Personal history of other diseases of the nervous system and sense organs: Secondary | ICD-10-CM

## 2018-02-26 DIAGNOSIS — R772 Abnormality of alphafetoprotein: Secondary | ICD-10-CM | POA: Diagnosis not present

## 2018-02-26 DIAGNOSIS — D2260 Melanocytic nevi of unspecified upper limb, including shoulder: Secondary | ICD-10-CM

## 2018-02-26 DIAGNOSIS — G43109 Migraine with aura, not intractable, without status migrainosus: Secondary | ICD-10-CM | POA: Diagnosis not present

## 2018-02-26 DIAGNOSIS — C4491 Basal cell carcinoma of skin, unspecified: Secondary | ICD-10-CM

## 2018-02-26 LAB — CBC WITH DIFFERENTIAL/PLATELET
BASOS ABS: 0 10*3/uL (ref 0.0–0.1)
Basophils Relative: 0.7 % (ref 0.0–3.0)
EOS ABS: 0.1 10*3/uL (ref 0.0–0.7)
Eosinophils Relative: 2.1 % (ref 0.0–5.0)
HCT: 41 % (ref 39.0–52.0)
HEMOGLOBIN: 14.2 g/dL (ref 13.0–17.0)
Lymphocytes Relative: 40.7 % (ref 12.0–46.0)
Lymphs Abs: 2.3 10*3/uL (ref 0.7–4.0)
MCHC: 34.7 g/dL (ref 30.0–36.0)
MCV: 100.4 fl — ABNORMAL HIGH (ref 78.0–100.0)
Monocytes Absolute: 0.3 10*3/uL (ref 0.1–1.0)
Monocytes Relative: 5.9 % (ref 3.0–12.0)
Neutro Abs: 2.9 10*3/uL (ref 1.4–7.7)
Neutrophils Relative %: 50.6 % (ref 43.0–77.0)
Platelets: 245 10*3/uL (ref 150.0–400.0)
RBC: 4.08 Mil/uL — AB (ref 4.22–5.81)
RDW: 13.2 % (ref 11.5–15.5)
WBC: 5.8 10*3/uL (ref 4.0–10.5)

## 2018-02-26 LAB — LIPID PANEL
CHOLESTEROL: 159 mg/dL (ref 0–200)
HDL: 39.4 mg/dL (ref >39.00)
LDL Cholesterol: 106 mg/dL — ABNORMAL HIGH (ref 0–99)
NonHDL: 119.98
TRIGLYCERIDES: 69 mg/dL (ref 0.0–149.0)
Total CHOL/HDL Ratio: 4
VLDL: 13.8 mg/dL (ref 0.0–40.0)

## 2018-02-26 LAB — COMPREHENSIVE METABOLIC PANEL
ALBUMIN: 4.5 g/dL (ref 3.5–5.2)
ALT: 18 U/L (ref 0–53)
AST: 14 U/L (ref 0–37)
Alkaline Phosphatase: 48 U/L (ref 39–117)
BILIRUBIN TOTAL: 0.7 mg/dL (ref 0.2–1.2)
BUN: 12 mg/dL (ref 6–23)
CALCIUM: 9.3 mg/dL (ref 8.4–10.5)
CO2: 26 mEq/L (ref 19–32)
CREATININE: 0.88 mg/dL (ref 0.40–1.50)
Chloride: 105 mEq/L (ref 96–112)
GFR: 93.26 mL/min (ref >60.00)
Glucose, Bld: 99 mg/dL (ref 70–99)
Potassium: 4.2 mEq/L (ref 3.5–5.1)
SODIUM: 138 meq/L (ref 135–145)
Total Protein: 7.5 g/dL (ref 6.0–8.3)

## 2018-02-26 LAB — PSA: PSA: 0.32 ng/mL (ref 0.10–4.00)

## 2018-02-26 MED ORDER — ZOSTER VAC RECOMB ADJUVANTED 50 MCG/0.5ML IM SUSR: 0.5000 mL | Freq: Once | INTRAMUSCULAR | 1 refills | Status: AC

## 2018-02-26 MED ORDER — AVANAFIL 100 MG PO TABS: 1.0000 | ORAL_TABLET | Freq: Every day | ORAL | 5 refills | Status: DC | PRN

## 2018-02-26 MED ORDER — SUMATRIPTAN SUCCINATE 6 MG/0.5ML ~~LOC~~ SOAJ: 1.0000 | Freq: Every day | SUBCUTANEOUS | 3 refills | Status: DC | PRN

## 2018-02-26 MED ORDER — VALACYCLOVIR HCL 1 G PO TABS: 1000.0000 mg | ORAL_TABLET | Freq: Three times a day (TID) | ORAL | 1 refills | Status: DC

## 2018-02-26 NOTE — Progress Notes (Signed)
Subjective:  Patient ID: Micheal Doyle, male    DOB: [birth year]  Age: 62 y.o. MRN: [id number]  CC: Annual Exam   HPI Micheal Doyle presents for a CPX.  Micheal Doyle complains of a 3-week history of intermittent headaches.  Micheal Doyle tells me Micheal Doyle has had a combination of migraine and cluster headaches for 30 years.  Micheal Doyle previously treated them with oral Relpax but Micheal Doyle says it did not help much.  Micheal Doyle denies nausea or vomiting with this headache.  Micheal Doyle describes the headache as being around his right eye, his right forehead, and his right scalp.  Micheal Doyle denies changes in his vision or hearing or paresthesias.  Micheal Doyle continues to have episodes of herpes simplex around his left eye.  His last episode was several weeks ago.  Micheal Doyle wants to know if Micheal Doyle can increase the dose of Valtrex and to get the shingles vaccine to prevent this from happening.  Outpatient Medications Prior to Visit  Medication Sig Dispense Refill  . valACYclovir (VALTREX) 1000 MG tablet Take 1 tablet (1,000 mg total) by mouth 2 (two) times daily. 14 tablet 1   No facility-administered medications prior to visit.     ROS Review of Systems  Constitutional: Negative.  Negative for diaphoresis and fatigue.  HENT: Negative.   Eyes: Negative for pain and visual disturbance.  Respiratory: Negative.  Negative for cough, chest tightness, shortness of breath and wheezing.   Cardiovascular: Negative for chest pain, palpitations and leg swelling.  Gastrointestinal: Negative for abdominal pain, blood in stool, constipation, diarrhea and nausea.  Endocrine: Negative.   Genitourinary: Negative.  Negative for difficulty urinating, penile pain, penile swelling, scrotal swelling, testicular pain and urgency.       +ED  Musculoskeletal: Negative.  Negative for arthralgias, myalgias and neck pain.  Skin: Negative.  Negative for color change, pallor and rash.  Neurological: Positive for headaches. Negative for dizziness, facial asymmetry, weakness, light-headedness and  numbness.  Hematological: Negative for adenopathy. Does not bruise/bleed easily.  Psychiatric/Behavioral: Negative.  Negative for sleep disturbance and suicidal ideas. The patient is not nervous/anxious.     Objective:  BP 112/80 (BP Location: Left Arm, Patient Position: Sitting, Cuff Size: Normal)   Pulse 82   Ht 5\' 10"  (1.778 m)   Wt 183 lb (83 kg)   SpO2 95%   BMI 26.26 kg/m   BP Readings from Last 3 Encounters:  02/26/18 112/80  06/05/16 121/77  04/02/16 116/78    Wt Readings from Last 3 Encounters:  02/26/18 183 lb (83 kg)  06/05/16 188 lb (85.3 kg)  04/02/16 184 lb 8 oz (83.7 kg)    Physical Exam  Constitutional: Micheal Doyle is oriented to person, place, and time. Micheal Doyle appears well-developed and well-nourished. No distress.  HENT:  Nose: Nose normal.  Mouth/Throat: No oropharyngeal exudate.  Eyes: Pupils are equal, round, and reactive to light. EOM are normal. No scleral icterus. Right eye exhibits normal extraocular motion and no nystagmus. Left eye exhibits normal extraocular motion and no nystagmus.  Neck: Normal range of motion. Neck supple. No JVD present. No thyromegaly present.  Cardiovascular: Normal rate, regular rhythm and normal heart sounds. Exam reveals no gallop.  No murmur heard. Pulmonary/Chest: Effort normal and breath sounds normal. Micheal Doyle has no wheezes. Micheal Doyle has no rhonchi. Micheal Doyle has no rales.  Abdominal: Soft. Bowel sounds are normal. Micheal Doyle exhibits no mass. There is no tenderness. Hernia confirmed negative in the right inguinal area and confirmed negative in the left inguinal area.  Genitourinary: Rectum normal, prostate normal, testes normal and penis normal. Rectal exam shows no external hemorrhoid, no internal hemorrhoid, no fissure, no mass, no tenderness, anal tone normal and guaiac negative stool. Prostate is not enlarged and not tender. Right testis shows no mass, no swelling and no tenderness. Left testis shows no mass, no swelling and no tenderness. Circumcised. No  penile erythema or penile tenderness. No discharge found.  Musculoskeletal: Normal range of motion. Micheal Doyle exhibits no edema, tenderness or deformity.  Lymphadenopathy:    Micheal Doyle has no cervical adenopathy. No inguinal adenopathy noted on the right or left side.  Neurological: Micheal Doyle is alert and oriented to person, place, and time. Micheal Doyle displays normal reflexes. No cranial nerve deficit or sensory deficit. Micheal Doyle exhibits normal muscle tone. Coordination normal.  Skin: Skin is dry. No rash noted. Micheal Doyle is not diaphoretic.     Vitals reviewed.   Lab Results  Component Value Date   WBC 5.8 02/26/2018   HGB 14.2 02/26/2018   HCT 41.0 02/26/2018   PLT 245.0 02/26/2018   GLUCOSE 99 02/26/2018   CHOL 159 02/26/2018   TRIG 69.0 02/26/2018   HDL 39.40 02/26/2018   LDLDIRECT 111.5 02/27/2013   LDLCALC 106 (H) 02/26/2018   ALT 18 02/26/2018   AST 14 02/26/2018   NA 138 02/26/2018   K 4.2 02/26/2018   CL 105 02/26/2018   CREATININE 0.88 02/26/2018   BUN 12 02/26/2018   CO2 26 02/26/2018   TSH 2.15 02/27/2013   PSA 0.32 02/26/2018   INR 1.0 04/11/2016    US Abdomen Complete W/elastography  Result Date: 07/17/2016 CLINICAL DATA:  Hepatitis C EXAM: ULTRASOUND ABDOMEN ULTRASOUND HEPATIC ELASTOGRAPHY TECHNIQUE: Sonography of the upper abdomen was performed. In addition, ultrasound elastography evaluation of the liver was performed. A region of interest was placed within the right lobe of the liver. Following application of a compressive sonographic pulse, shear waves were detected in the adjacent hepatic tissue and the shear wave velocity was calculated. Multiple assessments were performed at the selected site. Median shear wave velocity is correlated to a Metavir fibrosis score. COMPARISON:  None. FINDINGS: ULTRASOUND ABDOMEN Gallbladder: Small layering gallstones versus gallbladder polyps measuring up to 4 mm. No gallbladder wall thickening or pericholecystic fluid. Common bile duct: Diameter: 5 mm Liver: 1.9 x  1.9 x 2.2 cm mildly complex/ septated cyst within the left liver, benign. IVC: No abnormality visualized. Pancreas: Visualized portion unremarkable. Spleen: Two hyperechoic splenic lesions measuring up to 1.4 cm, likely benign. Right Kidney: Length: 11.5 cm.  No mass or hydronephrosis. Left Kidney: Length: 10.9 cm.  No mass or hydronephrosis. Abdominal aorta: No aneurysm visualized. Other findings: None. ULTRASOUND HEPATIC ELASTOGRAPHY Device: Siemens Helix VTQ Patient position: Supine Transducer 6C1 Number of measurements: 10 Hepatic segment:  8 Median velocity:   1.98  m/sec IQR: 0.26 IQR/Median velocity ratio: 0.13 Corresponding Metavir fibrosis score:  F2 + some F3 Risk of fibrosis: Moderate Limitations of exam: None Pertinent findings noted on other imaging exams:  None Please note that abnormal shear wave velocities may also be identified in clinical settings other than with hepatic fibrosis, such as: acute hepatitis, elevated right heart and central venous pressures including use of beta blockers, veno-occlusive disease (Budd-Chiari), infiltrative processes such as mastocytosis/amyloidosis/infiltrative tumor, extrahepatic cholestasis, in the post-prandial state, and liver transplantation. Correlation with patient history, laboratory data, and clinical condition recommended. IMPRESSION: ULTRASOUND ABDOMEN: Small layering gallstones versus gallbladder polyps measuring up to 4 mm. No associated inflammatory changes. 2.2 cm mildly complex/septated  hepatic cyst, benign. ULTRASOUND HEPATIC ELASTOGRAPHY: Median hepatic shear wave velocity is calculated at 1.98 m/sec. Corresponding Metavir fibrosis score is  F2 + some F3. Risk of fibrosis is Moderate. Follow-up: Additional testing appropriate Electronically Signed   By: Julian Hy M.D.   On: 07/17/2016 09:53    Assessment & Plan:   Micheal Doyle was seen today for annual exam.  Diagnoses and all orders for this visit:  Chronic hepatitis C without hepatic coma  ([place])- Micheal Doyle was previously treated for this.  His hep C RNA viral load remains undetectable.  Micheal Doyle is negative for HIV and syphilis.  His liver enzymes are normal and his AFP is normal now. -     Comprehensive metabolic panel; Future -     CBC with Differential/Platelet; Future -     Hepatitis C RNA quantitative; Future -     RPR; Future -     HIV antibody; Future  Routine general medical examination at a health care facility- Exam completed, labs reviewed, vaccines reviewed, screening for colon cancer is up-to-date, patient education material was given. -     PSA; Future -     Lipid panel; Future  Elevated AFP- His AFP level is normal now.  This is reassuring that Micheal Doyle does not have hepatocellular carcinoma. -     AFP tumor marker; Future  Migraine aura without headache- I recommended that Micheal Doyle try an injectable triptan and to see neurology for evaluation of the headaches. -     SUMAtriptan 6 MG/0.5ML SOAJ; Inject 1 Act into the skin daily as needed. -     Ambulatory referral to Neurology  Erectile dysfunction due to arterial insufficiency -     Avanafil (STENDRA) 100 MG TABS; Take 1 tablet by mouth daily as needed.  Atypical nevus of clavicle -     Ambulatory referral to Dermatology  Basal cell carcinoma (BCC), unspecified site -     Ambulatory referral to Dermatology  H/O herpes zoster keratoconjunctivitis -     Zoster Vaccine Adjuvanted [facility]) injection; Inject 0.5 mLs into the muscle once for 1 dose. -     valACYclovir (VALTREX) 1000 MG tablet; Take 1 tablet (1,000 mg total) by mouth 3 (three) times daily.   I have changed Micheal Doyle's valACYclovir. I am also having him start on SUMAtriptan, Zoster Vaccine Adjuvanted, and Avanafil.  Meds ordered this encounter  Medications  . SUMAtriptan 6 MG/0.5ML SOAJ    Sig: Inject 1 Act into the skin daily as needed.    Dispense:  8 Cartridge    Refill:  3  . Zoster Vaccine Adjuvanted [facility]) injection    Sig: Inject 0.5 mLs into  the muscle once for 1 dose.    Dispense:  0.5 mL    Refill:  1  . valACYclovir (VALTREX) 1000 MG tablet    Sig: Take 1 tablet (1,000 mg total) by mouth 3 (three) times daily.    Dispense:  21 tablet    Refill:  1  . Avanafil (STENDRA) 100 MG TABS    Sig: Take 1 tablet by mouth daily as needed.    Dispense:  10 tablet    Refill:  5     Follow-up: Return in about 3 months (around 05/29/2018).  Scarlette Calico, MD

## 2018-02-26 NOTE — Patient Instructions (Signed)

## 2018-02-28 ENCOUNTER — Encounter: Payer: Self-pay | Admitting: Internal Medicine

## 2018-02-28 LAB — RPR: RPR: NONREACTIVE

## 2018-02-28 LAB — HEPATITIS C RNA QUANTITATIVE
HCV QUANT LOG: NOT DETECTED {Log_IU}/mL
HCV RNA, PCR, QN: NOT DETECTED [IU]/mL

## 2018-02-28 LAB — HIV ANTIBODY (ROUTINE TESTING W REFLEX): HIV: NONREACTIVE

## 2018-02-28 LAB — AFP TUMOR MARKER: AFP-Tumor Marker: 3.5 ng/mL (ref <6.1)

## 2018-03-05 ENCOUNTER — Telehealth: Payer: Self-pay | Admitting: Internal Medicine

## 2018-03-05 NOTE — Telephone Encounter (Signed)
Pt would like a RX for the STENDRA,  Please advise

## 2018-03-11 ENCOUNTER — Other Ambulatory Visit: Payer: Self-pay | Admitting: Internal Medicine

## 2018-03-11 DIAGNOSIS — N5201 Erectile dysfunction due to arterial insufficiency: Secondary | ICD-10-CM

## 2018-03-11 MED ORDER — AVANAFIL 100 MG PO TABS: 1.0000 | ORAL_TABLET | Freq: Every day | ORAL | 5 refills | Status: DC | PRN

## 2018-03-11 NOTE — Telephone Encounter (Signed)
Tried to call pt but vm is full and I was unable to leave a message.

## 2018-03-11 NOTE — Telephone Encounter (Signed)
I have sent the RX to [facility] in [place] He may get a call from them to authorize this

## 2018-04-24 NOTE — Progress Notes (Deleted)
NEUROLOGY CONSULTATION NOTE  Micheal Doyle MRN: [id number] DOB: April 20, 1956  Referring provider: Scarlette Calico, MD Primary care provider: Scarlette Calico, MD  Reason for consult:  headaches  HISTORY OF PRESENT ILLNESS: Micheal Doyle is a 62 year old ***-handed male who presents for headaches.  History supplemented by referring provider's note.  Onset:  *** Location:  *** Quality:  *** Intensity:  ***.  *** denies new headache, thunderclap headache or severe headache that wakes *** from sleep. Aura:  *** Prodrome:  *** Postdrome:  *** Associated symptoms:  ***.  *** denies associated unilateral numbness or weakness. Duration:  *** Frequency:  *** Frequency of abortive medication: *** Triggers:  *** Exacerbating factors:  *** Relieving factors:  *** Activity:  ***  Current NSAIDS:  *** Current analgesics:  *** Current triptans:  Sumatriptan 6mg  [place] Current ergotamine:  *** Current anti-emetic:  *** Current muscle relaxants:  *** Current anti-anxiolytic:  *** Current sleep aide:  *** Current Antihypertensive medications:  *** Current Antidepressant medications:  *** Current Anticonvulsant medications:  *** Current anti-CGRP:  *** Current Vitamins/Herbal/Supplements:  *** Current Antihistamines/Decongestants:  *** Other therapy:  *** Other medication:  ***  Past NSAIDS:  *** Past analgesics:  *** Past abortive triptans:  *** Past abortive ergotamine:  *** Past muscle relaxants:  *** Past anti-emetic:  *** Past antihypertensive medications:  *** Past antidepressant medications:  *** Past anticonvulsant medications:  *** Past anti-CGRP:  *** Past vitamins/Herbal/Supplements:  *** Past antihistamines/decongestants:  *** Other past therapies:  ***  Caffeine:  *** Alcohol:  *** Smoker:  *** Diet:  *** Exercise:  *** Depression:  ***; Anxiety:  *** Other pain:  *** Sleep hygiene:  *** Family history of headache:  ***  02/26/18 LABS:  CBC with WBC 5.8, HGB  14.2, HCT 41, PLT 245; CMP with Na 138, K 4.2, Cl 105, CO2 26, glucose 99, BUN 12, Cr 0.88, t bili 0.7, ALP 48, AST 14, ALT 18.  PAST MEDICAL HISTORY: Past Medical History:  Diagnosis Date  . Cluster headache syndrome 1995  . Herpes simplex eyelid dermatitis   . History of basal cell cancer    on back/ over 10 years ago    PAST SURGICAL HISTORY: Past Surgical History:  Procedure Laterality Date  . WISDOM TOOTH EXTRACTION      MEDICATIONS: Current Outpatient Medications on File Prior to Visit  Medication Sig Dispense Refill  . Avanafil (STENDRA) 100 MG TABS Take 1 tablet by mouth daily as needed. 10 tablet 5  . SUMAtriptan 6 MG/0.5ML SOAJ Inject 1 Act into the skin daily as needed. 8 Cartridge 3  . valACYclovir (VALTREX) 1000 MG tablet Take 1 tablet (1,000 mg total) by mouth 3 (three) times daily. 21 tablet 1   No current facility-administered medications on file prior to visit.     ALLERGIES: No Known Allergies  FAMILY HISTORY: Family History  Problem Relation Age of Onset  . Cancer Neg Hx   . Diabetes Neg Hx   . Alcohol abuse Neg Hx   . Depression Neg Hx   . Drug abuse Neg Hx   . Early death Neg Hx   . Hearing loss Neg Hx   . Heart disease Neg Hx   . Hyperlipidemia Neg Hx   . Hypertension Neg Hx   . Kidney disease Neg Hx   . Learning disabilities Neg Hx   . Stroke Neg Hx    ***.  SOCIAL HISTORY: Social History   Socioeconomic History  .  Marital status: Divorced    Spouse name: Not on file  . Number of children: Not on file  . Years of education: Not on file  . Highest education level: Not on file  Occupational History  . Not on file  Social Needs  . Financial resource strain: Not on file  . Food insecurity:    Worry: Not on file    Inability: Not on file  . Transportation needs:    Medical: Not on file    Non-medical: Not on file  Tobacco Use  . Smoking status: Former Smoker    Packs/day: 0.50    Years: 40.00    Pack years: 20.00    Types:  Cigarettes  . Smokeless tobacco: Never Used  Substance and Sexual Activity  . Alcohol use: No  . Drug use: Yes    Types: Marijuana  . Sexual activity: Yes    Birth control/protection: None  Lifestyle  . Physical activity:    Days per week: Not on file    Minutes per session: Not on file  . Stress: Not on file  Relationships  . Social connections:    Talks on phone: Not on file    Gets together: Not on file    Attends religious service: Not on file    Active member of club or organization: Not on file    Attends meetings of clubs or organizations: Not on file    Relationship status: Not on file  . Intimate partner violence:    Fear of current or ex partner: Not on file    Emotionally abused: Not on file    Physically abused: Not on file    Forced sexual activity: Not on file  Other Topics Concern  . Not on file  Social History Narrative  . Not on file    REVIEW OF SYSTEMS: Constitutional: No fevers, chills, or sweats, no generalized fatigue, change in appetite Eyes: No visual changes, double vision, eye pain Ear, nose and throat: No hearing loss, ear pain, nasal congestion, sore throat Cardiovascular: No chest pain, palpitations Respiratory:  No shortness of breath at rest or with exertion, wheezes GastrointestinaI: No nausea, vomiting, diarrhea, abdominal pain, fecal incontinence Genitourinary:  No dysuria, urinary retention or frequency Musculoskeletal:  No neck pain, back pain Integumentary: No rash, pruritus, skin lesions Neurological: as above Psychiatric: No depression, insomnia, anxiety Endocrine: No palpitations, fatigue, diaphoresis, mood swings, change in appetite, change in weight, increased thirst Hematologic/Lymphatic:  No purpura, petechiae. Allergic/Immunologic: no itchy/runny eyes, nasal congestion, recent allergic reactions, rashes  PHYSICAL EXAM: *** General: No acute distress.  Patient appears ***-groomed.  *** Head:   Normocephalic/atraumatic Eyes:  fundi examined but not visualized Neck: supple, no paraspinal tenderness, full range of motion Back: No paraspinal tenderness Heart: regular rate and rhythm Lungs: Clear to auscultation bilaterally. Vascular: No carotid bruits. Neurological Exam: Mental status: alert and oriented to person, place, and time, recent and remote memory intact, fund of knowledge intact, attention and concentration intact, speech fluent and not dysarthric, language intact. Cranial nerves: CN I: not tested CN II: pupils equal, round and reactive to light, visual fields intact CN III, IV, VI:  full range of motion, no nystagmus, no ptosis CN V: facial sensation intact CN VII: upper and lower face symmetric CN VIII: hearing intact CN IX, X: gag intact, uvula midline CN XI: sternocleidomastoid and trapezius muscles intact CN XII: tongue midline Bulk & Tone: normal, no fasciculations. Motor:  5/5 throughout *** Sensation:  Pinprick ***  temperature *** and vibration sensation intact.  ***. Deep Tendon Reflexes:  2+ throughout, *** toes downgoing.  *** Finger to nose testing:  Without dysmetria.  *** Heel to shin:  Without dysmetria.  *** Gait:  Normal station and stride.  Able to turn and tandem walk. [place] ***.  IMPRESSION: ***  PLAN: ***  Thank you for allowing me to take part in the care of this patient.  Metta Clines, DO  CC: ***

## 2018-04-25 ENCOUNTER — Ambulatory Visit: Payer: BLUE CROSS/BLUE SHIELD | Admitting: Neurology

## 2018-04-25 ENCOUNTER — Encounter: Payer: Self-pay | Admitting: Neurology

## 2018-04-25 DIAGNOSIS — Z029 Encounter for administrative examinations, unspecified: Secondary | ICD-10-CM

## 2019-06-17 ENCOUNTER — Other Ambulatory Visit: Payer: Self-pay

## 2019-06-17 DIAGNOSIS — Z20822 Contact with and (suspected) exposure to covid-19: Secondary | ICD-10-CM

## 2019-06-20 LAB — NOVEL CORONAVIRUS, NAA: SARS-CoV-2, NAA: NOT DETECTED

## 2020-02-02 LAB — HEPATIC FUNCTION PANEL
Alkaline Phosphatase: 79 (ref 25–125)
Bilirubin, Direct: 0.3 (ref 0.01–0.4)

## 2020-02-02 LAB — BASIC METABOLIC PANEL
BUN: 10 (ref 4–21)
CO2: 27 — AB (ref 13–22)
Chloride: 98 — AB (ref 99–108)
Creatinine: 0.6 (ref 0.6–1.3)
Glucose: 165
Potassium: 3.7 (ref 3.4–5.3)
Sodium: 133 — AB (ref 137–147)

## 2020-02-02 LAB — CBC AND DIFFERENTIAL
HCT: 32 — AB (ref 41–53)
Hemoglobin: 11.1 — AB (ref 13.5–17.5)
Platelets: 236 (ref 150–399)
WBC: 4.6

## 2020-02-02 LAB — COMPREHENSIVE METABOLIC PANEL
Albumin: 3.5 (ref 3.5–5.0)
Calcium: 8.5 — AB (ref 8.7–10.7)

## 2020-02-11 ENCOUNTER — Other Ambulatory Visit: Payer: Self-pay

## 2020-02-11 ENCOUNTER — Encounter: Payer: Self-pay | Admitting: Internal Medicine

## 2020-02-11 ENCOUNTER — Ambulatory Visit (INDEPENDENT_AMBULATORY_CARE_PROVIDER_SITE_OTHER): Payer: 59 | Admitting: Internal Medicine

## 2020-02-11 VITALS — BP 144/80 | HR 88 | Temp 98.6°F | Resp 16 | Ht 70.0 in | Wt 181.0 lb

## 2020-02-11 DIAGNOSIS — B0230 Zoster ocular disease, unspecified: Secondary | ICD-10-CM | POA: Diagnosis not present

## 2020-02-11 DIAGNOSIS — R634 Abnormal weight loss: Secondary | ICD-10-CM

## 2020-02-11 DIAGNOSIS — Z8669 Personal history of other diseases of the nervous system and sense organs: Secondary | ICD-10-CM

## 2020-02-11 DIAGNOSIS — G44019 Episodic cluster headache, not intractable: Secondary | ICD-10-CM | POA: Insufficient documentation

## 2020-02-11 DIAGNOSIS — N5201 Erectile dysfunction due to arterial insufficiency: Secondary | ICD-10-CM

## 2020-02-11 DIAGNOSIS — R739 Hyperglycemia, unspecified: Secondary | ICD-10-CM

## 2020-02-11 DIAGNOSIS — H25013 Cortical age-related cataract, bilateral: Secondary | ICD-10-CM

## 2020-02-11 DIAGNOSIS — D539 Nutritional anemia, unspecified: Secondary | ICD-10-CM | POA: Diagnosis not present

## 2020-02-11 DIAGNOSIS — E871 Hypo-osmolality and hyponatremia: Secondary | ICD-10-CM

## 2020-02-11 DIAGNOSIS — Z8619 Personal history of other infectious and parasitic diseases: Secondary | ICD-10-CM

## 2020-02-11 MED ORDER — UBRELVY 100 MG PO TABS: 1.0000 | ORAL_TABLET | Freq: Every day | ORAL | 0 refills | Status: DC | PRN

## 2020-02-11 NOTE — Patient Instructions (Signed)
Cluster Headache A cluster headache is a type of headache that causes deep, intense head pain. Cluster headaches can last from 15 minutes to 3 hours. They usually occur:  On one side of the head. They may occur on the other side when a new cluster of headaches begins.  Repeatedly over weeks to months.  Several times a day.  At the same time of day, often at night.  More often in the fall and springtime. What are the causes? The cause of this condition is not known. What increases the risk? This condition is more likely to develop in:  Males.  People who drink alcohol.  People who smoke or use products that contain nicotine or tobacco.  People who take medicines that cause blood vessels to expand, such as nitroglycerin.  People who take antihistamines. What are the signs or symptoms? Symptoms of this condition include:  Severe pain on one side of the head that begins behind or around your eye or temple.  Pain on one side of the head.  Nausea.  Sensitivity to light.  Runny nose and nasal stuffiness.  Sweaty, pale skin on the face.  Droopy or swollen eyelid, eye redness, or tearing.  Restlessness and agitation. How is this diagnosed? This condition may be diagnosed based on:  Your symptoms.  A physical exam. Your health care provider may order tests to see if your headaches are caused by another medical condition. These tests may show that you do not have cluster headaches. Tests may include:  A CT scan of your head.  An MRI of your head.  Lab tests. How is this treated? This condition may be treated with:  Medicines to relieve pain and to prevent repeated (recurrent) attacks. Some people may need a combination of medicines.  Oxygen. This helps to relieve pain. Follow these instructions at home: Headache diary Keep a headache diary as told by your health care provider. Doing this can help you and your health care provider figure out what triggers your  headaches. In your headache diary, include information about:  The time of day that your headache started and what you were doing when it began.  How long your headache lasted.  Where your pain started and whether it moved to other areas.  The type of pain, such as burning, stabbing, throbbing, or cramping.  Your level of pain. Use a pain scale and rate the pain with a number from 1 (mild) up to 10 (severe).  The treatment that you used, and any change in symptoms after treatment.  Medicines  Take over-the-counter and prescription medicines only as told by your health care provider.  Do not drive or use heavy machinery while taking prescription pain medicine.  Use oxygen as told by your health care provider. Lifestyle  Follow a regular sleep schedule. Do not vary the time that you go to bed or the amount that you sleep from day to day. It is important to stay on the same schedule during a cluster period to help prevent headaches.  Exercise regularly.  Eat a healthy diet and avoid foods that may trigger your headaches.  Avoid alcohol.  Do not use any products that contain nicotine or tobacco, such as cigarettes and e-cigarettes. If you need help quitting, ask your health care provider. Contact a health care provider if:  Your headaches change, become more severe, or occur more often.  The medicine or oxygen that your health care provider recommended does not help. Get help right away   if:  You faint.  You have weakness or numbness, especially on one side of your body or face.  You have double vision.  You have nausea or vomiting that does not go away within several hours.  You have trouble talking, walking, or keeping your balance.  You have pain or stiffness in your neck.  You have a fever. Summary  A cluster headache is a type of headache that causes deep, intense head pain, usually on one side of the head.  Keep a headache diary to help discover what triggers  your headaches.  A regular sleep schedule can help prevent headaches. This information is not intended to replace advice given to you by your health care provider. Make sure you discuss any questions you have with your health care provider. Document Revised: 12/31/2018 Document Reviewed: 04/03/2016 Elsevier Patient Education  2020 [employer]  

## 2020-02-11 NOTE — Progress Notes (Signed)
Subjective:  Patient ID: Micheal Doyle, male    DOB: [birth year]  Age: 64 y.o. MRN: [id number]  CC: Anemia  This visit occurred during the SARS-CoV-2 public health emergency.  Safety protocols were in place, including screening questions prior to the visit, additional usage of staff PPE, and extensive cleaning of exam room while observing appropriate contact time as indicated for disinfecting solutions.    HPI BRYCIN KILLE presents for f/up - He developed the pain around his left eye about 2 weeks ago.  He describes it as a pounding/shooting pain that radiated through the left side of his face and head.  He has a history of cluster headache.  He also has a history of ocular HSV.  He thought he was having a flareup of HSV so he took 4 doses of valacyclovir but that did not help.  He never saw a rash.  He had nausea but no vomiting.  His eye was not red and he did not experience photo or phonophobia.  He was seen at an urgent care center and was found to be anemic and to have a low sodium and chloride level.  He requests a refill of Viagra for ED and a refill of valacyclovir.  Outpatient Medications Prior to Visit  Medication Sig Dispense Refill  . Avanafil (STENDRA) 100 MG TABS Take 1 tablet by mouth daily as needed. 10 tablet 5  . valACYclovir (VALTREX) 1000 MG tablet Take 1 tablet (1,000 mg total) by mouth 3 (three) times daily. 21 tablet 1  . SUMAtriptan 6 MG/0.5ML SOAJ Inject 1 Act into the skin daily as needed. 8 Cartridge 3   No facility-administered medications prior to visit.    ROS Review of Systems  Constitutional: Positive for unexpected weight change. Negative for appetite change, chills, diaphoresis, fatigue and fever.  HENT: Negative.  Negative for trouble swallowing and voice change.   Eyes: Negative for visual disturbance.  Respiratory: Negative for cough, chest tightness, shortness of breath and wheezing.   Cardiovascular: Negative for chest pain, palpitations and leg  swelling.  Gastrointestinal: Positive for nausea. Negative for abdominal pain, constipation, diarrhea and vomiting.  Endocrine: Negative.   Genitourinary: Negative.  Negative for difficulty urinating.       +ED  Musculoskeletal: Negative for arthralgias and myalgias.  Skin: Negative.  Negative for color change, pallor and rash.  Neurological: Positive for headaches. Negative for dizziness, tremors, seizures, syncope, facial asymmetry, speech difficulty, weakness and numbness.  Hematological: Negative for adenopathy. Does not bruise/bleed easily.  Psychiatric/Behavioral: Negative.     Objective:  BP (!) 144/80 (BP Location: Left Arm, Patient Position: Sitting, Cuff Size: Normal)   Pulse 88   Temp 98.6 F (37 C) (Oral)   Resp 16   Ht 5\' 10"  (1.778 m)   Wt 181 lb (82.1 kg)   SpO2 96%   BMI 25.97 kg/m   BP Readings from Last 3 Encounters:  02/11/20 (!) 144/80  02/26/18 112/80  06/05/16 121/77    Wt Readings from Last 3 Encounters:  02/11/20 181 lb (82.1 kg)  02/26/18 183 lb (83 kg)  06/05/16 188 lb (85.3 kg)    Physical Exam Vitals reviewed.  Constitutional:      General: He is not in acute distress.    Appearance: Normal appearance. He is not ill-appearing, toxic-appearing or diaphoretic.  HENT:     Nose: Nose normal.     Mouth/Throat:     Mouth: Mucous membranes are moist.  Eyes:  General: No scleral icterus.    Extraocular Movements: Extraocular movements intact.     Conjunctiva/sclera: Conjunctivae normal.     Right eye: Right conjunctiva is not injected. No hemorrhage.    Left eye: Left conjunctiva is not injected. No hemorrhage.    Pupils: Pupils are equal, round, and reactive to light.     Comments: Cataracts noted in both lenses  Cardiovascular:     Rate and Rhythm: Normal rate and regular rhythm.     Heart sounds: No murmur heard.   Pulmonary:     Effort: Pulmonary effort is normal.     Breath sounds: No stridor. No wheezing, rhonchi or rales.    Abdominal:     General: Abdomen is flat. Bowel sounds are normal. There is no distension.     Palpations: Abdomen is soft. There is no hepatomegaly, splenomegaly or mass.     Tenderness: There is no abdominal tenderness.  Musculoskeletal:        General: Normal range of motion.     Cervical back: Normal range of motion and neck supple. No tenderness.     Right lower leg: No edema.     Left lower leg: No edema.  Lymphadenopathy:     Cervical: No cervical adenopathy.  Skin:    General: Skin is warm and dry.     Coloration: Skin is not pale.     Findings: No rash.  Neurological:     General: No focal deficit present.     Mental Status: He is alert and oriented to person, place, and time. Mental status is at baseline.     Lab Results  Component Value Date   WBC 4.6 02/02/2020   HGB 11.1 (A) 02/02/2020   HCT 32 (A) 02/02/2020   PLT 236 02/02/2020   GLUCOSE 99 02/26/2018   CHOL 159 02/26/2018   TRIG 69.0 02/26/2018   HDL 39.40 02/26/2018   LDLDIRECT 111.5 02/27/2013   LDLCALC 106 (H) 02/26/2018   ALT 18 02/26/2018   AST 14 02/26/2018   NA 133 (A) 02/02/2020   K 3.7 02/02/2020   CL 98 (A) 02/02/2020   CREATININE 0.6 02/02/2020   BUN 10 02/02/2020   CO2 27 (A) 02/02/2020   TSH 2.15 02/27/2013   PSA 0.32 02/26/2018   INR 1.0 04/11/2016    US ABDOMEN COMPLETE W/ELASTOGRAPHY  Result Date: 07/17/2016 CLINICAL DATA:  Hepatitis C EXAM: ULTRASOUND ABDOMEN ULTRASOUND HEPATIC ELASTOGRAPHY TECHNIQUE: Sonography of the upper abdomen was performed. In addition, ultrasound elastography evaluation of the liver was performed. A region of interest was placed within the right lobe of the liver. Following application of a compressive sonographic pulse, shear waves were detected in the adjacent hepatic tissue and the shear wave velocity was calculated. Multiple assessments were performed at the selected site. Median shear wave velocity is correlated to a Metavir fibrosis score. COMPARISON:   None. FINDINGS: ULTRASOUND ABDOMEN Gallbladder: Small layering gallstones versus gallbladder polyps measuring up to 4 mm. No gallbladder wall thickening or pericholecystic fluid. Common bile duct: Diameter: 5 mm Liver: 1.9 x 1.9 x 2.2 cm mildly complex/ septated cyst within the left liver, benign. IVC: No abnormality visualized. Pancreas: Visualized portion unremarkable. Spleen: Two hyperechoic splenic lesions measuring up to 1.4 cm, likely benign. Right Kidney: Length: 11.5 cm.  No mass or hydronephrosis. Left Kidney: Length: 10.9 cm.  No mass or hydronephrosis. Abdominal aorta: No aneurysm visualized. Other findings: None. ULTRASOUND HEPATIC ELASTOGRAPHY Device: Siemens Helix VTQ Patient position: Supine Transducer 6C1 Number  of measurements: 10 Hepatic segment:  8 Median velocity:   1.98  m/sec IQR: 0.26 IQR/Median velocity ratio: 0.13 Corresponding Metavir fibrosis score:  F2 + some F3 Risk of fibrosis: Moderate Limitations of exam: None Pertinent findings noted on other imaging exams:  None Please note that abnormal shear wave velocities may also be identified in clinical settings other than with hepatic fibrosis, such as: acute hepatitis, elevated right heart and central venous pressures including use of beta blockers, veno-occlusive disease (Budd-Chiari), infiltrative processes such as mastocytosis/amyloidosis/infiltrative tumor, extrahepatic cholestasis, in the post-prandial state, and liver transplantation. Correlation with patient history, laboratory data, and clinical condition recommended. IMPRESSION: ULTRASOUND ABDOMEN: Small layering gallstones versus gallbladder polyps measuring up to 4 mm. No associated inflammatory changes. 2.2 cm mildly complex/septated hepatic cyst, benign. ULTRASOUND HEPATIC ELASTOGRAPHY: Median hepatic shear wave velocity is calculated at 1.98 m/sec. Corresponding Metavir fibrosis score is  F2 + some F3. Risk of fibrosis is Moderate. Follow-up: Additional testing appropriate  Electronically Signed   By: Julian Hy M.D.   On: 07/17/2016 09:53    Assessment & Plan:   Clive was seen today for anemia.  Diagnoses and all orders for this visit:  Deficiency anemia- I will screen him for vitamin deficiencies. -     CBC with Differential/Platelet; Future -     Vitamin B12; Future -     Cancel: IBC panel; Future -     Folate; Future -     Cancel: Ferritin; Future -     Vitamin B1; Future -     Reticulocytes; Future -     Iron, TIBC and Ferritin Panel; Future -     Iron, TIBC and Ferritin Panel  Hyperglycemia- Will check an A1C to see if he is diabetic. -     Basic metabolic panel; Future -     Hemoglobin A1c; Future  Episodic cluster headache, not intractable- I think his symptoms are consistent with a cluster headache.  I recommended that he try a CGRP antagonist. -     Ubrogepant (UBRELVY) 100 MG TABS; Take 1 tablet by mouth daily as needed.  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  I do see cataracts on his exam today so I recommended that he see an ophthalmologist. -     Ambulatory referral to Ophthalmology -     Ambulatory referral to Infectious Disease -     HSV Type I/II IgG, IgMw/ reflex; Future -     valACYclovir (VALTREX) 1000 MG tablet; Take 1 tablet (1,000 mg total) by mouth 3 (three) times daily.  Cortical age-related cataract of both eyes -     Ambulatory referral to Ophthalmology  H/O herpes zoster keratoconjunctivitis -     valACYclovir (VALTREX) 1000 MG tablet; Take 1 tablet (1,000 mg total) by mouth 3 (three) times daily.  Erectile dysfunction due to arterial insufficiency -     Avanafil (STENDRA) 100 MG TABS; Take 1 tablet by mouth daily as needed.  Weight loss, abnormal- He has a history of tobacco abuse and now has weight loss and hyponatremia.  I have asked him to undergo a CT scan of the chest with contrast to see if there is concern for lung cancer. -     CT Chest W Contrast;  Future  Chronic hyponatremia -     CT Chest W Contrast; Future   I have discontinued Tyion B. Haque's SUMAtriptan. I am also having him start on Ubrelvy. Additionally, I  am having him maintain his valACYclovir and Stendra.  Meds ordered this encounter  Medications  . Ubrogepant (UBRELVY) 100 MG TABS    Sig: Take 1 tablet by mouth daily as needed.    Dispense:  4 tablet    Refill:  0  . valACYclovir (VALTREX) 1000 MG tablet    Sig: Take 1 tablet (1,000 mg total) by mouth 3 (three) times daily.    Dispense:  21 tablet    Refill:  1  . Avanafil (STENDRA) 100 MG TABS    Sig: Take 1 tablet by mouth daily as needed.    Dispense:  10 tablet    Refill:  5   I spent 50 minutes in preparing to see the patient by review of recent labs, imaging and procedures, obtaining and reviewing separately obtained history, communicating with the patient and family or caregiver, ordering medications, tests or procedures, and documenting clinical information in the EHR including the differential Dx, treatment, and any further evaluation and other management of 1. Deficiency anemia 2. Hyperglycemia 3. Episodic cluster headache, not intractable 4. Zoster ocular disease 5. Cortical age-related cataract of both eyes 6. H/O herpes zoster keratoconjunctivitis 7. Erectile dysfunction due to arterial insufficiency 8. Weight loss, abnormal 9. Chronic hyponatremia     Follow-up: Return in about 3 weeks (around 03/03/2020).  Scarlette Calico, MD

## 2020-02-12 ENCOUNTER — Telehealth: Payer: Self-pay | Admitting: Emergency Medicine

## 2020-02-12 ENCOUNTER — Other Ambulatory Visit: Payer: 59

## 2020-02-12 DIAGNOSIS — B0230 Zoster ocular disease, unspecified: Secondary | ICD-10-CM

## 2020-02-12 DIAGNOSIS — R739 Hyperglycemia, unspecified: Secondary | ICD-10-CM

## 2020-02-12 DIAGNOSIS — E519 Thiamine deficiency, unspecified: Secondary | ICD-10-CM

## 2020-02-12 DIAGNOSIS — D539 Nutritional anemia, unspecified: Secondary | ICD-10-CM

## 2020-02-12 LAB — IRON,TIBC AND FERRITIN PANEL
%SAT: 45 % (calc) (ref 20–48)
Ferritin: 745 ng/mL — ABNORMAL HIGH (ref 24–380)
Iron: 164 ug/dL (ref 50–180)
TIBC: 365 mcg/dL (calc) (ref 250–425)

## 2020-02-12 NOTE — Telephone Encounter (Signed)
Pt is requesting a refill on medication Avanafil (STENDRA) 100 MG TABS Pharmacy is [place]. Thanks.

## 2020-02-13 MED ORDER — STENDRA 100 MG PO TABS: 1.0000 | ORAL_TABLET | Freq: Every day | ORAL | 5 refills | Status: DC | PRN

## 2020-02-13 MED ORDER — VALACYCLOVIR HCL 1 G PO TABS: 1000.0000 mg | ORAL_TABLET | Freq: Three times a day (TID) | ORAL | 1 refills | Status: DC

## 2020-02-15 DIAGNOSIS — R634 Abnormal weight loss: Secondary | ICD-10-CM | POA: Insufficient documentation

## 2020-02-15 DIAGNOSIS — E871 Hypo-osmolality and hyponatremia: Secondary | ICD-10-CM | POA: Insufficient documentation

## 2020-02-15 LAB — HSV TYPE I/II IGG, IGMW/ REFLEX
HSV 1 Glycoprotein G Ab, IgG: 4.89 index — ABNORMAL HIGH (ref 0.00–0.90)
HSV 1 IgM: <1:10 {titer}
HSV 2 IgG, Type Spec: 7.28 index — ABNORMAL HIGH (ref 0.00–0.90)
HSV 2 IgM: <1:10 {titer}

## 2020-02-16 LAB — CBC WITH DIFFERENTIAL/PLATELET
Absolute Monocytes: 300 cells/uL (ref 200–950)
Basophils Absolute: 50 cells/uL (ref 0–200)
Basophils Relative: 1 %
Eosinophils Absolute: 50 cells/uL (ref 15–500)
Eosinophils Relative: 1 %
HCT: 37.8 % — ABNORMAL LOW (ref 38.5–50.0)
Hemoglobin: 12.9 g/dL — ABNORMAL LOW (ref 13.2–17.1)
Lymphs Abs: 2255 cells/uL (ref 850–3900)
MCH: 33.8 pg — ABNORMAL HIGH (ref 27.0–33.0)
MCHC: 34.1 g/dL (ref 32.0–36.0)
MCV: 99 fL (ref 80.0–100.0)
MPV: 9.8 fL (ref 7.5–12.5)
Monocytes Relative: 6 %
Neutro Abs: 2345 cells/uL (ref 1500–7800)
Neutrophils Relative %: 46.9 %
Platelets: 617 10*3/uL — ABNORMAL HIGH (ref 140–400)
RBC: 3.82 10*6/uL — ABNORMAL LOW (ref 4.20–5.80)
RDW: 13.4 % (ref 11.0–15.0)
Total Lymphocyte: 45.1 %
WBC: 5 10*3/uL (ref 3.8–10.8)

## 2020-02-16 LAB — BASIC METABOLIC PANEL
BUN: 18 mg/dL (ref 7–25)
CO2: 24 mmol/L (ref 20–32)
Calcium: 9.7 mg/dL (ref 8.6–10.3)
Chloride: 102 mmol/L (ref 98–110)
Creat: 0.98 mg/dL (ref 0.70–1.25)
Glucose, Bld: 114 mg/dL — ABNORMAL HIGH (ref 65–99)
Potassium: 5.7 mmol/L — ABNORMAL HIGH (ref 3.5–5.3)
Sodium: 137 mmol/L (ref 135–146)

## 2020-02-16 LAB — HEMOGLOBIN A1C
Hgb A1c MFr Bld: 5.8 % of total Hgb — ABNORMAL HIGH (ref <5.7)
Mean Plasma Glucose: 120 (calc)
eAG (mmol/L): 6.6 (calc)

## 2020-02-16 LAB — FERRITIN: Ferritin: 742 ng/mL — ABNORMAL HIGH (ref 24–380)

## 2020-02-16 LAB — VITAMIN B12: Vitamin B-12: 986 pg/mL (ref 200–1100)

## 2020-02-16 LAB — VITAMIN B1: Vitamin B1 (Thiamine): 9 nmol/L (ref 8–30)

## 2020-02-16 LAB — RETICULOCYTES
ABS Retic: 91680 cells/uL — ABNORMAL HIGH (ref 25000–9000)
Retic Ct Pct: 2.4 %

## 2020-02-16 LAB — FOLATE: Folate: 10.5 ng/mL

## 2020-02-17 ENCOUNTER — Other Ambulatory Visit: Payer: Self-pay | Admitting: Internal Medicine

## 2020-02-17 DIAGNOSIS — E519 Thiamine deficiency, unspecified: Secondary | ICD-10-CM

## 2020-02-17 MED ORDER — VITAMIN B-1 50 MG PO TABS: 50.0000 mg | ORAL_TABLET | ORAL | 1 refills | Status: DC

## 2020-02-18 NOTE — Telephone Encounter (Signed)
-----   Message from Octavio Manns sent at 02/16/2020  2:53 PM EDT ----- Pt's insurance has denied his CT scan due to:  "they haven't got a report that pt has smoked at least 30 pack-years. No records show pt is a current smoker or that you quit smoking in the last 15 years"  I can see in pt's "snapshot' that -  Former Smoker (Quit Date: ), 0.5 ppd, 20 pack-years  I can call them back with this information but do you know how long ago he quit?  Do you have anything else you would like me to say to them or maybe put a pulmonary referral in.   Let me know  Thanks Cecille Rubin

## 2020-02-18 NOTE — Telephone Encounter (Signed)
Pt is requesting rx for Stendra. Please advise.   Can you add the order for xray? Pt is requesting to recheck CBC and BMP because he is starting to feel better. Can the labs be entered?   I can enter with your approval.

## 2020-02-19 ENCOUNTER — Other Ambulatory Visit: Payer: 59

## 2020-02-19 ENCOUNTER — Ambulatory Visit (INDEPENDENT_AMBULATORY_CARE_PROVIDER_SITE_OTHER): Payer: 59

## 2020-02-19 ENCOUNTER — Other Ambulatory Visit: Payer: Self-pay

## 2020-02-19 DIAGNOSIS — E519 Thiamine deficiency, unspecified: Secondary | ICD-10-CM

## 2020-02-19 NOTE — Addendum Note (Signed)
Addended by: Hoyt Koch on: 02/19/2020 08:48 AM   Modules accepted: Orders

## 2020-02-19 NOTE — Telephone Encounter (Signed)
Labs and CXR placed

## 2020-02-20 LAB — CBC WITH DIFFERENTIAL/PLATELET
Absolute Monocytes: 345 cells/uL (ref 200–950)
Basophils Absolute: 48 cells/uL (ref 0–200)
Basophils Relative: 0.9 %
Eosinophils Absolute: 101 cells/uL (ref 15–500)
Eosinophils Relative: 1.9 %
HCT: 36.6 % — ABNORMAL LOW (ref 38.5–50.0)
Hemoglobin: 12.6 g/dL — ABNORMAL LOW (ref 13.2–17.1)
Lymphs Abs: 2586 cells/uL (ref 850–3900)
MCH: 34.3 pg — ABNORMAL HIGH (ref 27.0–33.0)
MCHC: 34.4 g/dL (ref 32.0–36.0)
MCV: 99.7 fL (ref 80.0–100.0)
MPV: 10.1 fL (ref 7.5–12.5)
Monocytes Relative: 6.5 %
Neutro Abs: 2221 cells/uL (ref 1500–7800)
Neutrophils Relative %: 41.9 %
Platelets: 439 10*3/uL — ABNORMAL HIGH (ref 140–400)
RBC: 3.67 10*6/uL — ABNORMAL LOW (ref 4.20–5.80)
RDW: 14 % (ref 11.0–15.0)
Total Lymphocyte: 48.8 %
WBC: 5.3 10*3/uL (ref 3.8–10.8)

## 2020-02-20 LAB — BASIC METABOLIC PANEL WITH GFR
BUN: 15 mg/dL (ref 7–25)
CO2: 26 mmol/L (ref 20–32)
Calcium: 9.4 mg/dL (ref 8.6–10.3)
Chloride: 103 mmol/L (ref 98–110)
Creat: 1.13 mg/dL (ref 0.70–1.25)
GFR, Est African American: 79 mL/min/{1.73_m2} (ref >=60)
GFR, Est Non African American: 68 mL/min/{1.73_m2} (ref >=60)
Glucose, Bld: 107 mg/dL — ABNORMAL HIGH (ref 65–99)
Potassium: 4.9 mmol/L (ref 3.5–5.3)
Sodium: 138 mmol/L (ref 135–146)

## 2020-02-29 ENCOUNTER — Other Ambulatory Visit: Payer: Self-pay | Admitting: Internal Medicine

## 2020-02-29 DIAGNOSIS — R918 Other nonspecific abnormal finding of lung field: Secondary | ICD-10-CM

## 2020-03-03 ENCOUNTER — Other Ambulatory Visit: Payer: Self-pay

## 2020-03-03 ENCOUNTER — Encounter: Payer: Self-pay | Admitting: Internal Medicine

## 2020-03-03 ENCOUNTER — Ambulatory Visit (INDEPENDENT_AMBULATORY_CARE_PROVIDER_SITE_OTHER): Payer: 59 | Admitting: Internal Medicine

## 2020-03-03 VITALS — BP 140/86 | HR 87 | Temp 98.2°F | Resp 16 | Ht 70.0 in | Wt 181.0 lb

## 2020-03-03 DIAGNOSIS — R9389 Abnormal findings on diagnostic imaging of other specified body structures: Secondary | ICD-10-CM | POA: Diagnosis not present

## 2020-03-03 DIAGNOSIS — E785 Hyperlipidemia, unspecified: Secondary | ICD-10-CM

## 2020-03-03 DIAGNOSIS — R911 Solitary pulmonary nodule: Secondary | ICD-10-CM

## 2020-03-03 DIAGNOSIS — E519 Thiamine deficiency, unspecified: Secondary | ICD-10-CM

## 2020-03-03 DIAGNOSIS — N5201 Erectile dysfunction due to arterial insufficiency: Secondary | ICD-10-CM | POA: Diagnosis not present

## 2020-03-03 DIAGNOSIS — Z0000 Encounter for general adult medical examination without abnormal findings: Secondary | ICD-10-CM

## 2020-03-03 NOTE — Patient Instructions (Signed)
Anemia  Anemia is a condition in which you do not have enough red blood cells or hemoglobin. Hemoglobin is a substance in red blood cells that carries oxygen. When you do not have enough red blood cells or hemoglobin (are anemic), your body cannot get enough oxygen and your organs may not work properly. As a result, you may feel very tired or have other problems. What are the causes? Common causes of anemia include:  Excessive bleeding. Anemia can be caused by excessive bleeding inside or outside the body, including bleeding from the intestine or from periods in women.  Poor nutrition.  Long-lasting (chronic) kidney, thyroid, and liver disease.  Bone marrow disorders.  Cancer and treatments for cancer.  HIV (human immunodeficiency virus) and AIDS (acquired immunodeficiency syndrome).  Treatments for HIV and AIDS.  Spleen problems.  Blood disorders.  Infections, medicines, and autoimmune disorders that destroy red blood cells. What are the signs or symptoms? Symptoms of this condition include:  Minor weakness.  Dizziness.  Headache.  Feeling heartbeats that are irregular or faster than normal (palpitations).  Shortness of breath, especially with exercise.  Paleness.  Cold sensitivity.  Indigestion.  Nausea.  Difficulty sleeping.  Difficulty concentrating. Symptoms may occur suddenly or develop slowly. If your anemia is mild, you may not have symptoms. How is this diagnosed? This condition is diagnosed based on:  Blood tests.  Your medical history.  A physical exam.  Bone marrow biopsy. Your health care provider may also check your stool (feces) for blood and may do additional testing to look for the cause of your bleeding. You may also have other tests, including:  Imaging tests, such as a CT scan or MRI.  Endoscopy.  Colonoscopy. How is this treated? Treatment for this condition depends on the cause. If you continue to lose a lot of blood, you may  need to be treated at a hospital. Treatment may include:  Taking supplements of iron, vitamin S31, or folic acid.  Taking a hormone medicine (erythropoietin) that can help to stimulate red blood cell growth.  Having a blood transfusion. This may be needed if you lose a lot of blood.  Making changes to your diet.  Having surgery to remove your spleen. Follow these instructions at home:  Take over-the-counter and prescription medicines only as told by your health care provider.  Take supplements only as told by your health care provider.  Follow any diet instructions that you were given.  Keep all follow-up visits as told by your health care provider. This is important. Contact a health care provider if:  You develop new bleeding anywhere in the body. Get help right away if:  You are very weak.  You are short of breath.  You have pain in your abdomen or chest.  You are dizzy or feel faint.  You have trouble concentrating.  You have bloody or black, tarry stools.  You vomit repeatedly or you vomit up blood. Summary  Anemia is a condition in which you do not have enough red blood cells or enough of a substance in your red blood cells that carries oxygen (hemoglobin).  Symptoms may occur suddenly or develop slowly.  If your anemia is mild, you may not have symptoms.  This condition is diagnosed with blood tests as well as a medical history and physical exam. Other tests may be needed.  Treatment for this condition depends on the cause of the anemia. This information is not intended to replace advice given to you by  your health care provider. Make sure you discuss any questions you have with your health care provider. Document Revised: 07/05/2017 Document Reviewed: 08/24/2016 Elsevier Patient Education  [place].

## 2020-03-03 NOTE — Progress Notes (Signed)
Subjective:  Patient ID: Micheal Doyle, male    DOB: 1955-08-24  Age: 64 y.o. MRN: [id number]  CC: Annual Exam, Anemia, and Hyperlipidemia  This visit occurred during the SARS-CoV-2 public health emergency.  Safety protocols were in place, including screening questions prior to the visit, additional usage of staff PPE, and extensive cleaning of exam room while observing appropriate contact time as indicated for disinfecting solutions.    HPI Micheal Doyle presents for a CPX.  He returns for follow-up on anemia and hyponatremia.  He had a low normal thiamine level so I prescribed a thiamine supplement.  He said he has felt much better since taking thiamine with respect to energy and fatigue.  He is a prior smoker so I recommended that we screen his lungs to see if there is any lung pathology that would cause hyponatremia.  His insurance would not cover a CT scan so someone else ordered a chest x-ray that was abnormal.  I have now been told that his insurance will pay for the lung CT.  Outpatient Medications Prior to Visit  Medication Sig Dispense Refill  . Avanafil (STENDRA) 100 MG TABS Take 1 tablet by mouth daily as needed. 10 tablet 5  . thiamine (VITAMIN B-1) 50 MG tablet Take 1 tablet (50 mg total) by mouth every other day. 45 tablet 1  . Ubrogepant (UBRELVY) 100 MG TABS Take 1 tablet by mouth daily as needed. 4 tablet 0  . valACYclovir (VALTREX) 1000 MG tablet Take 1 tablet (1,000 mg total) by mouth 3 (three) times daily. 21 tablet 1   No facility-administered medications prior to visit.    ROS Review of Systems  Constitutional: Negative for appetite change, chills, diaphoresis, fatigue and fever.  HENT: Negative.  Negative for sinus pressure, sore throat and trouble swallowing.   Eyes: Negative.   Respiratory: Negative for cough, chest tightness, shortness of breath and wheezing.   Cardiovascular: Negative for chest pain, palpitations and leg swelling.  Gastrointestinal:  Negative for abdominal pain, blood in stool, constipation, diarrhea, nausea and vomiting.  Endocrine: Negative.  Negative for polyuria.  Genitourinary: Negative.  Negative for difficulty urinating, hematuria, scrotal swelling and urgency.  Musculoskeletal: Negative.  Negative for arthralgias and myalgias.  Skin: Negative.  Negative for color change and rash.  Neurological: Negative.  Negative for dizziness, weakness, light-headedness and headaches.  Hematological: Negative for adenopathy. Does not bruise/bleed easily.  Psychiatric/Behavioral: Negative.     Objective:  BP (!) 140/86 (BP Location: Left Arm, Patient Position: Sitting, Cuff Size: Normal)   Pulse 87   Temp 98.2 F (36.8 C) (Oral)   Resp 16   Ht 5\' 10"  (1.778 m)   Wt 181 lb (82.1 kg)   SpO2 94%   BMI 25.97 kg/m   BP Readings from Last 3 Encounters:  03/03/20 (!) 140/86  02/11/20 (!) 144/80  02/26/18 112/80    Wt Readings from Last 3 Encounters:  03/03/20 181 lb (82.1 kg)  02/11/20 181 lb (82.1 kg)  02/26/18 183 lb (83 kg)    Physical Exam Vitals reviewed.  Constitutional:      Appearance: Normal appearance.  HENT:     Nose: Nose normal.     Mouth/Throat:     Mouth: Mucous membranes are moist.  Eyes:     General: No scleral icterus.    Conjunctiva/sclera: Conjunctivae normal.  Cardiovascular:     Rate and Rhythm: Normal rate and regular rhythm.     Heart sounds: No murmur heard.  Pulmonary:     Effort: Pulmonary effort is normal.     Breath sounds: No stridor. No wheezing, rhonchi or rales.  Abdominal:     General: Abdomen is flat.     Palpations: There is no mass.     Tenderness: There is no abdominal tenderness. There is no guarding or rebound.     Hernia: No hernia is present. There is no hernia in the left inguinal area or right inguinal area.  Genitourinary:    Pubic Area: No rash.      Penis: Normal and circumcised. No discharge, swelling or lesions.      Testes: Normal.        Right:  Mass, tenderness or swelling not present.        Left: Mass, tenderness or swelling not present.     Epididymis:     Right: Normal. No mass.     Left: Normal. No mass.     Prostate: Normal. Not enlarged, not tender and no nodules present.     Rectum: Normal. Guaiac result negative. No mass, tenderness, anal fissure, external hemorrhoid or internal hemorrhoid. Normal anal tone.  Musculoskeletal:        General: Normal range of motion.     Cervical back: Neck supple.     Right lower leg: No edema.     Left lower leg: No edema.  Lymphadenopathy:     Cervical: No cervical adenopathy.     Lower Body: No right inguinal adenopathy. No left inguinal adenopathy.  Skin:    General: Skin is warm and dry.     Coloration: Skin is not pale.  Neurological:     General: No focal deficit present.     Mental Status: He is alert and oriented to person, place, and time. Mental status is at baseline.  Psychiatric:        Mood and Affect: Mood normal.        Behavior: Behavior normal.        Thought Content: Thought content normal.        Judgment: Judgment normal.     Lab Results  Component Value Date   WBC 6.7 03/03/2020   HGB 14.1 03/03/2020   HCT 41.0 03/03/2020   PLT 270 03/03/2020   GLUCOSE 107 (H) 02/19/2020   CHOL 180 03/03/2020   TRIG 149 03/03/2020   HDL 43 03/03/2020   LDLDIRECT 111.5 02/27/2013   LDLCALC 111 (H) 03/03/2020   ALT 18 02/26/2018   AST 14 02/26/2018   NA 138 02/19/2020   K 4.9 02/19/2020   CL 103 02/19/2020   CREATININE 1.13 02/19/2020   BUN 15 02/19/2020   CO2 26 02/19/2020   TSH 2.88 03/03/2020   PSA 0.4 03/03/2020   INR 1.0 04/11/2016   HGBA1C 5.8 (H) 02/12/2020    US ABDOMEN COMPLETE W/ELASTOGRAPHY  Result Date: 07/17/2016 CLINICAL DATA:  Hepatitis C EXAM: ULTRASOUND ABDOMEN ULTRASOUND HEPATIC ELASTOGRAPHY TECHNIQUE: Sonography of the upper abdomen was performed. In addition, ultrasound elastography evaluation of the liver was performed. A region of  interest was placed within the right lobe of the liver. Following application of a compressive sonographic pulse, shear waves were detected in the adjacent hepatic tissue and the shear wave velocity was calculated. Multiple assessments were performed at the selected site. Median shear wave velocity is correlated to a Metavir fibrosis score. COMPARISON:  None. FINDINGS: ULTRASOUND ABDOMEN Gallbladder: Small layering gallstones versus gallbladder polyps measuring up to 4 mm. No gallbladder wall thickening or  pericholecystic fluid. Common bile duct: Diameter: 5 mm Liver: 1.9 x 1.9 x 2.2 cm mildly complex/ septated cyst within the left liver, benign. IVC: No abnormality visualized. Pancreas: Visualized portion unremarkable. Spleen: Two hyperechoic splenic lesions measuring up to 1.4 cm, likely benign. Right Kidney: Length: 11.5 cm.  No mass or hydronephrosis. Left Kidney: Length: 10.9 cm.  No mass or hydronephrosis. Abdominal aorta: No aneurysm visualized. Other findings: None. ULTRASOUND HEPATIC ELASTOGRAPHY Device: Siemens Helix VTQ Patient position: Supine Transducer 6C1 Number of measurements: 10 Hepatic segment:  8 Median velocity:   1.98  m/sec IQR: 0.26 IQR/Median velocity ratio: 0.13 Corresponding Metavir fibrosis score:  F2 + some F3 Risk of fibrosis: Moderate Limitations of exam: None Pertinent findings noted on other imaging exams:  None Please note that abnormal shear wave velocities may also be identified in clinical settings other than with hepatic fibrosis, such as: acute hepatitis, elevated right heart and central venous pressures including use of beta blockers, veno-occlusive disease (Budd-Chiari), infiltrative processes such as mastocytosis/amyloidosis/infiltrative tumor, extrahepatic cholestasis, in the post-prandial state, and liver transplantation. Correlation with patient history, laboratory data, and clinical condition recommended. IMPRESSION: ULTRASOUND ABDOMEN: Small layering gallstones versus  gallbladder polyps measuring up to 4 mm. No associated inflammatory changes. 2.2 cm mildly complex/septated hepatic cyst, benign. ULTRASOUND HEPATIC ELASTOGRAPHY: Median hepatic shear wave velocity is calculated at 1.98 m/sec. Corresponding Metavir fibrosis score is  F2 + some F3. Risk of fibrosis is Moderate. Follow-up: Additional testing appropriate Electronically Signed   By: Julian Hy M.D.   On: 07/17/2016 09:53  COMPARISON:  None.   02/19/2020  FINDINGS: Trachea is midline. Cardiomediastinal contours are normal. There is an increase in the size of LEFT hilar structures going from central to peripheral along the LEFT heart border. Best seen on AP view.  Lungs are mildly hyperinflated.  Subtle increased interstitial markings seen in the lower chest bilaterally without dense consolidation or sign of pleural effusion.  Visualized skeletal structures on limited assessment are unremarkable.  IMPRESSION: Subtle increase in prominence of LEFT hilum in infrahilar region may represent mild nodal enlargement or mild prominence of central pulmonary vasculature.  Increased interstitial markings bilaterally suggested near the lung bases. Without priors for comparison this could be due to chronic changes or mild pneumonitis. If there are acute symptoms consider follow-up PA and lateral chest for further assessment. In the absence of symptoms would suggest CT of the chest for LEFT hilar evaluation in particular.   Assessment & Plan:   Terrez was seen today for annual exam, anemia and hyperlipidemia.  Diagnoses and all orders for this visit:  Thiamine deficiency- He feels better and his H&H are normal now. -     CBC with Differential/Platelet; Future -     CBC with Differential/Platelet  Routine general medical examination at a health care facility- Exam completed, labs reviewed, vaccines reviewed and updated, patient education was given. -     Lipid panel; Future -      PSA; Future -     PSA -     Lipid panel  Erectile dysfunction due to arterial insufficiency- He has responded well to the PDE-5 inhibitor. -     TSH; Future -     TSH  Abnormal chest xray- He is a prior smoker. Will screen for lung cancer with a CT. -     CT Chest W Contrast; Future  Solitary pulmonary nodule -     CT Chest W Contrast; Future  Hyperlipidemia LDL goal <100-  His ASCVD risk score is >15% so I have asked to take a statin for CV risk reduction. -     rosuvastatin (CRESTOR) 5 MG tablet; Take 1 tablet (5 mg total) by mouth daily.   I am having Zimere B. Mudgett start on rosuvastatin. I am also having him maintain his Roselyn Meier, valACYclovir, Stendra, and thiamine.  Meds ordered this encounter  Medications  . rosuvastatin (CRESTOR) 5 MG tablet    Sig: Take 1 tablet (5 mg total) by mouth daily.    Dispense:  90 tablet    Refill:  1     Follow-up: Return in about 3 months (around 06/03/2020).  Scarlette Calico, MD

## 2020-03-04 ENCOUNTER — Encounter: Payer: Self-pay | Admitting: Internal Medicine

## 2020-03-04 DIAGNOSIS — E785 Hyperlipidemia, unspecified: Secondary | ICD-10-CM | POA: Insufficient documentation

## 2020-03-04 DIAGNOSIS — R911 Solitary pulmonary nodule: Secondary | ICD-10-CM | POA: Insufficient documentation

## 2020-03-04 LAB — CBC WITH DIFFERENTIAL/PLATELET
Absolute Monocytes: 362 cells/uL (ref 200–950)
Basophils Absolute: 40 cells/uL (ref 0–200)
Basophils Relative: 0.6 %
Eosinophils Absolute: 101 cells/uL (ref 15–500)
Eosinophils Relative: 1.5 %
HCT: 41 % (ref 38.5–50.0)
Hemoglobin: 14.1 g/dL (ref 13.2–17.1)
Lymphs Abs: 2312 cells/uL (ref 850–3900)
MCH: 34.5 pg — ABNORMAL HIGH (ref 27.0–33.0)
MCHC: 34.4 g/dL (ref 32.0–36.0)
MCV: 100.2 fL — ABNORMAL HIGH (ref 80.0–100.0)
MPV: 10.4 fL (ref 7.5–12.5)
Monocytes Relative: 5.4 %
Neutro Abs: 3886 cells/uL (ref 1500–7800)
Neutrophils Relative %: 58 %
Platelets: 270 10*3/uL (ref 140–400)
RBC: 4.09 10*6/uL — ABNORMAL LOW (ref 4.20–5.80)
RDW: 14 % (ref 11.0–15.0)
Total Lymphocyte: 34.5 %
WBC: 6.7 10*3/uL (ref 3.8–10.8)

## 2020-03-04 LAB — LIPID PANEL
Cholesterol: 180 mg/dL (ref <200)
HDL: 43 mg/dL (ref >=40)
LDL Cholesterol (Calc): 111 mg/dL (calc) — ABNORMAL HIGH
Non-HDL Cholesterol (Calc): 137 mg/dL (calc) — ABNORMAL HIGH (ref <130)
Total CHOL/HDL Ratio: 4.2 (calc) (ref <5.0)
Triglycerides: 149 mg/dL (ref <150)

## 2020-03-04 LAB — TSH: TSH: 2.88 mIU/L (ref 0.40–4.50)

## 2020-03-04 LAB — PSA: PSA: 0.4 ng/mL (ref <=4.0)

## 2020-03-04 MED ORDER — ROSUVASTATIN CALCIUM 5 MG PO TABS: 5.0000 mg | ORAL_TABLET | Freq: Every day | ORAL | 1 refills | Status: DC

## 2020-03-08 ENCOUNTER — Encounter: Payer: Self-pay | Admitting: Internal Medicine

## 2020-03-15 ENCOUNTER — Ambulatory Visit
Admission: RE | Admit: 2020-03-15 | Discharge: 2020-03-15 | Disposition: A | Discharge status: Home / Self Care | Payer: 59 | Source: Ambulatory Visit | Attending: Internal Medicine | Admitting: Internal Medicine

## 2020-03-15 DIAGNOSIS — R918 Other nonspecific abnormal finding of lung field: Secondary | ICD-10-CM

## 2020-03-15 MED ORDER — IOPAMIDOL (ISOVUE-300) INJECTION 61%
75.0000 mL | Freq: Once | INTRAVENOUS | Status: AC | PRN
  Administered 2020-03-15: 75 mL via INTRAVENOUS

## 2020-03-22 ENCOUNTER — Ambulatory Visit (INDEPENDENT_AMBULATORY_CARE_PROVIDER_SITE_OTHER): Payer: 59 | Admitting: Internal Medicine

## 2020-03-22 ENCOUNTER — Other Ambulatory Visit: Payer: Self-pay

## 2020-03-22 ENCOUNTER — Encounter: Payer: Self-pay | Admitting: Internal Medicine

## 2020-03-22 VITALS — BP 110/68 | HR 77 | Temp 98.1°F | Resp 16 | Ht 70.0 in | Wt 185.0 lb

## 2020-03-22 DIAGNOSIS — K769 Liver disease, unspecified: Secondary | ICD-10-CM | POA: Diagnosis not present

## 2020-03-22 DIAGNOSIS — R160 Hepatomegaly, not elsewhere classified: Secondary | ICD-10-CM | POA: Diagnosis not present

## 2020-03-22 DIAGNOSIS — G44019 Episodic cluster headache, not intractable: Secondary | ICD-10-CM | POA: Diagnosis not present

## 2020-03-22 DIAGNOSIS — K7460 Unspecified cirrhosis of liver: Secondary | ICD-10-CM | POA: Diagnosis not present

## 2020-03-22 DIAGNOSIS — K635 Polyp of colon: Secondary | ICD-10-CM

## 2020-03-22 MED ORDER — NURTEC 75 MG PO TBDP: 1.0000 | ORAL_TABLET | ORAL | 5 refills | Status: AC

## 2020-03-22 NOTE — Progress Notes (Signed)
Subjective:  Patient ID: Micheal Doyle, male    DOB: [birth year]  Age: 64 y.o. MRN: [id number]  CC: Follow-up  This visit occurred during the SARS-CoV-2 public health emergency.  Safety protocols were in place, including screening questions prior to the visit, additional usage of staff PPE, and extensive cleaning of exam room while observing appropriate contact time as indicated for disinfecting solutions.    HPI Micheal Doyle presents for f/up - He recently had a CT scan of his lungs performed as a work-up of hyponatremia and an abnormal chest x-ray.  He is a prior smoker.  He was found to have benign nodules that will need to be rechecked in 6 to 12 months.  The CT scan also picked up on a tumor in the left lobe of his liver.  He has cirrhosis from hepatitis C.  He had the hepatitis C infection eradicated several years ago.  He continues to complain of intermittent headaches.  He can have a headache several times a week.  He has gotten symptom relief with Roselyn Meier and wants to continue taking it.  Outpatient Medications Prior to Visit  Medication Sig Dispense Refill  . Avanafil (STENDRA) 100 MG TABS Take 1 tablet by mouth daily as needed. 10 tablet 5  . rosuvastatin (CRESTOR) 5 MG tablet Take 1 tablet (5 mg total) by mouth daily. 90 tablet 1  . thiamine (VITAMIN B-1) 50 MG tablet Take 1 tablet (50 mg total) by mouth every other day. 45 tablet 1  . valACYclovir (VALTREX) 1000 MG tablet Take 1 tablet (1,000 mg total) by mouth 3 (three) times daily. 21 tablet 1  . Ubrogepant (UBRELVY) 100 MG TABS Take 1 tablet by mouth daily as needed. 4 tablet 0   No facility-administered medications prior to visit.    ROS Review of Systems  Constitutional: Negative for appetite change, diaphoresis, fatigue and unexpected weight change.  HENT: Negative.   Eyes: Negative for visual disturbance.  Respiratory: Negative for cough, chest tightness, shortness of breath and wheezing.   Cardiovascular: Negative  for chest pain, palpitations and leg swelling.  Gastrointestinal: Negative for abdominal pain, constipation, diarrhea, nausea and vomiting.  Endocrine: Negative.   Genitourinary: Negative.  Negative for difficulty urinating, dysuria and hematuria.  Musculoskeletal: Negative for arthralgias and myalgias.  Skin: Negative.  Negative for color change and pallor.  Neurological: Negative.  Negative for dizziness, weakness and light-headedness.  Hematological: Negative for adenopathy. Does not bruise/bleed easily.  Psychiatric/Behavioral: Negative.     Objective:  BP 110/68 (BP Location: Left Arm, Patient Position: Sitting, Cuff Size: Normal)   Pulse 77   Temp 98.1 F (36.7 C) (Oral)   Resp 16   Ht 5\' 10"  (1.778 m)   Wt 185 lb (83.9 kg)   SpO2 95%   BMI 26.54 kg/m   BP Readings from Last 3 Encounters:  03/22/20 110/68  03/03/20 (!) 140/86  02/11/20 (!) 144/80    Wt Readings from Last 3 Encounters:  03/22/20 185 lb (83.9 kg)  03/03/20 181 lb (82.1 kg)  02/11/20 181 lb (82.1 kg)    Physical Exam Vitals reviewed.  HENT:     Nose: Nose normal.     Mouth/Throat:     Mouth: Mucous membranes are moist.  Eyes:     General: No scleral icterus.    Conjunctiva/sclera: Conjunctivae normal.  Cardiovascular:     Rate and Rhythm: Normal rate and regular rhythm.     Heart sounds: No murmur heard.  Pulmonary:     Effort: Pulmonary effort is normal.     Breath sounds: No stridor. No wheezing, rhonchi or rales.  Abdominal:     General: Abdomen is flat. Bowel sounds are normal. There is no distension.     Palpations: Abdomen is soft. There is no hepatomegaly, splenomegaly or mass.     Tenderness: There is no abdominal tenderness.  Musculoskeletal:        General: Normal range of motion.     Cervical back: Neck supple.     Right lower leg: No edema.     Left lower leg: No edema.  Lymphadenopathy:     Cervical: No cervical adenopathy.  Skin:    General: Skin is warm and dry.      Coloration: Skin is not pale.  Neurological:     General: No focal deficit present.     Mental Status: He is alert.     Lab Results  Component Value Date   WBC 6.7 03/03/2020   HGB 14.1 03/03/2020   HCT 41.0 03/03/2020   PLT 270 03/03/2020   GLUCOSE 107 (H) 02/19/2020   CHOL 180 03/03/2020   TRIG 149 03/03/2020   HDL 43 03/03/2020   LDLDIRECT 111.5 02/27/2013   LDLCALC 111 (H) 03/03/2020   ALT 18 02/26/2018   AST 14 02/26/2018   NA 138 02/19/2020   K 4.9 02/19/2020   CL 103 02/19/2020   CREATININE 1.13 02/19/2020   BUN 15 02/19/2020   CO2 26 02/19/2020   TSH 2.88 03/03/2020   PSA 0.4 03/03/2020   INR 1.0 04/11/2016   HGBA1C 5.8 (H) 02/12/2020    CT Chest W Contrast  Result Date: 03/15/2020 CLINICAL DATA:  64 year old male with lung nodule EXAM: CT CHEST WITH CONTRAST TECHNIQUE: Multidetector CT imaging of the chest was performed during intravenous contrast administration. CONTRAST:  3mL ISOVUE-300 IOPAMIDOL (ISOVUE-300) INJECTION 61% COMPARISON:  Chest radiograph dated 02/19/2020. FINDINGS: Cardiovascular: There is no cardiomegaly or pericardial effusion. There is 3 vessel coronary vascular calcification. There is moderate atherosclerotic calcification of the thoracic aorta. No aneurysmal dilatation or dissection. The origins of the great vessels of the aortic arch appear patent as visualized. Evaluation of the pulmonary arteries is limited due to suboptimal opacification and timing of the contrast. No pulmonary artery embolus identified. Mediastinum/Nodes: Top-normal right hilar lymph nodes measure 10 mm in short axis. The esophagus and the thyroid gland are grossly unremarkable. No mediastinal fluid collection. Lungs/Pleura: Background of centrilobular and paraseptal emphysema. There is a 4 mm nodule in the lingula (127/8) and a 5 mm nodule at the left lung base (130/8). Bibasilar linear atelectasis/scarring. No lobar consolidation, pleural effusion, or pneumothorax. The central  airways are patent. Upper Abdomen: Heterogeneous appearing spleen. There is irregularity of the liver contour in keeping with cirrhosis. Partially visualized 14 mm hypodense lesion in the left lobe the liver. This is not characterized but may correspond to the complex cyst seen on the ultrasound of 2017. Further initial evaluation with ultrasound on a nonemergent basis recommended. MRI may provide better evaluation depending on ultrasound findings. Musculoskeletal: Degenerative changes of the spine. No acute osseous pathology. IMPRESSION: 1. No acute intrathoracic pathology. No CT evidence of pulmonary artery embolus. 2. Emphysema. 3. Small left lung nodules measuring up to 5 mm. Follow-up with CT in 6-12 months recommended. 4. Cirrhosis. 5. Partially visualized 14 mm hypodense lesion in the left lobe of the liver. Further initial evaluation with ultrasound on a nonemergent basis recommended. MRI may provide  better evaluation depending on ultrasound findings. 6. Aortic Atherosclerosis (ICD10-I70.0) and Emphysema (ICD10-J43.9). Electronically Signed   By: Anner Crete M.D.   On: 03/15/2020 21:11    Assessment & Plan:   Ralphie was seen today for follow-up.  Diagnoses and all orders for this visit:  Polyp of colon, unspecified part of colon, unspecified type -     Ambulatory referral to Gastroenterology  Episodic cluster headache, not intractable- I recommended that he take rimegepant every other day to prevent the development of cluster headaches. -     Rimegepant Sulfate (NURTEC) 75 MG TBDP; Take 1 tablet by mouth every other day.  Liver disease -     MR Abdomen W Wo Contrast; Future  Cirrhosis of liver without ascites, unspecified hepatic cirrhosis type ([place]) -     MR Abdomen W Wo Contrast; Future  Hypodense mass of left lobe of liver- I recommended that he have an MRI of his liver to screen for hepatocellular carcinoma. -     MR Abdomen W Wo Contrast; Future   I have changed Amardeep B.  Villasenor's Ubrelvy to [employer]. I am also having him maintain his valACYclovir, Stendra, thiamine, and rosuvastatin.  Meds ordered this encounter  Medications  . Rimegepant Sulfate (NURTEC) 75 MG TBDP    Sig: Take 1 tablet by mouth every other day.    Dispense:  16 tablet    Refill:  5     Follow-up: Return in about 3 months (around 06/22/2020).  Scarlette Calico, MD

## 2020-03-22 NOTE — Patient Instructions (Signed)
Cluster Headache Cluster headaches are deeply painful. They normally occur on one side of your head, but they may switch sides. Often, cluster headaches:  Are severe.  Happen often for a few weeks or months and then go away for a while.  Last from 15 minutes to 3 hours.  Happen at the same time each day.  Happen at night.  Happen many times a day. Follow these instructions at home:        Follow instructions from your doctor to care for yourself at home:  Go to bed at the same time each night. Get the same amount of sleep every night.  Avoid alcohol.  Stop smoking if you smoke. This includes cigarettes and e-cigarettes.  Take over-the-counter and prescription medicines only as told by your doctor.  Do not drive or use heavy machinery while taking prescription pain medicine.  Use oxygen as told by your doctor.  Exercise regularly.  Eat a healthy diet.  Write down when each headache happened, what kind of pain you had, how bad your pain was, and what you tried to help your pain. This is called a headache diary. Use it as told by your doctor. Contact a doctor if:  Your headaches get worse or they happen more often.  Your medicines are not helping. Get help right away if:  You pass out (faint).  You get weak or lose feeling (have numbness) on one side of your body or face.  You see two of everything (double vision).  You feel sick to your stomach (nauseous) or you throw up (vomit), and you do not stop after many hours.  You have trouble with your balance or with walking.  You have trouble talking.  You have neck pain or stiffness.  You have a fever. This information is not intended to replace advice given to you by your health care provider. Make sure you discuss any questions you have with your health care provider. Document Revised: 05/20/2019 Document Reviewed: 03/30/2016 Elsevier Patient Education  2020 [employer].

## 2020-03-24 ENCOUNTER — Ambulatory Visit: Payer: 59 | Admitting: Internal Medicine

## 2020-03-28 ENCOUNTER — Encounter: Payer: Self-pay | Admitting: Gastroenterology

## 2020-04-14 ENCOUNTER — Ambulatory Visit
Admission: RE | Admit: 2020-04-14 | Discharge: 2020-04-14 | Disposition: A | Discharge status: Home / Self Care | Payer: 59 | Source: Ambulatory Visit | Attending: Internal Medicine | Admitting: Internal Medicine

## 2020-04-14 DIAGNOSIS — K769 Liver disease, unspecified: Secondary | ICD-10-CM

## 2020-04-14 DIAGNOSIS — R160 Hepatomegaly, not elsewhere classified: Secondary | ICD-10-CM

## 2020-04-14 DIAGNOSIS — K7460 Unspecified cirrhosis of liver: Secondary | ICD-10-CM

## 2020-04-14 MED ORDER — GADOBENATE DIMEGLUMINE 529 MG/ML IV SOLN
17.0000 mL | Freq: Once | INTRAVENOUS | Status: AC | PRN
  Administered 2020-04-14: 17 mL via INTRAVENOUS

## 2020-04-20 ENCOUNTER — Telehealth: Payer: Self-pay | Admitting: Internal Medicine

## 2020-04-20 NOTE — Telephone Encounter (Signed)
    Please call patient to discuss MRI results

## 2020-04-20 NOTE — Telephone Encounter (Signed)
Attempted to call pt, VM is full at this time. Can not leave a msg.

## 2020-04-21 NOTE — Telephone Encounter (Signed)
    Please return call to patient to discuss MRI results

## 2020-04-21 NOTE — Telephone Encounter (Signed)
Pt has been informed and expressed understanding.  

## 2020-05-05 ENCOUNTER — Ambulatory Visit: Payer: 59 | Admitting: Internal Medicine

## 2020-05-17 ENCOUNTER — Ambulatory Visit (AMBULATORY_SURGERY_CENTER): Payer: Self-pay | Admitting: *Deleted

## 2020-05-17 ENCOUNTER — Other Ambulatory Visit: Payer: Self-pay

## 2020-05-17 VITALS — Ht 70.0 in | Wt 184.6 lb

## 2020-05-17 DIAGNOSIS — Z86010 Personal history of colonic polyps: Secondary | ICD-10-CM

## 2020-05-17 MED ORDER — SUPREP BOWEL PREP KIT 17.5-3.13-1.6 GM/177ML PO SOLN: 1.0000 | Freq: Once | ORAL | 0 refills | Status: AC

## 2020-05-17 NOTE — Progress Notes (Signed)
Patient denies any allergies to egg or soy products. Patient denies complications with anesthesia/sedation.  Patient denies oxygen use at home and denies diet medications. Patient denied information on a colonoscopy procedure. Patient had both covid vaccinations, last one on 11/10/19.

## 2020-05-30 ENCOUNTER — Other Ambulatory Visit: Payer: Self-pay

## 2020-05-30 ENCOUNTER — Ambulatory Visit (AMBULATORY_SURGERY_CENTER): Payer: 59 | Admitting: Gastroenterology

## 2020-05-30 ENCOUNTER — Encounter: Payer: Self-pay | Admitting: Gastroenterology

## 2020-05-30 VITALS — BP 120/80 | HR 73 | Temp 98.0°F | Resp 18 | Ht 70.0 in | Wt 184.6 lb

## 2020-05-30 DIAGNOSIS — D127 Benign neoplasm of rectosigmoid junction: Secondary | ICD-10-CM

## 2020-05-30 DIAGNOSIS — Z86010 Personal history of colonic polyps: Secondary | ICD-10-CM | POA: Diagnosis not present

## 2020-05-30 DIAGNOSIS — D123 Benign neoplasm of transverse colon: Secondary | ICD-10-CM | POA: Diagnosis not present

## 2020-05-30 DIAGNOSIS — D120 Benign neoplasm of cecum: Secondary | ICD-10-CM | POA: Diagnosis not present

## 2020-05-30 MED ORDER — SODIUM CHLORIDE 0.9 % IV SOLN: 500.0000 mL | Freq: Once | INTRAVENOUS | Status: DC

## 2020-05-30 NOTE — Progress Notes (Signed)
VS by CW  Pt's states no medical or surgical changes since previsit or office visit.  

## 2020-05-30 NOTE — Patient Instructions (Signed)
Read all of the handouts given to you by your recovery room nurse.  Thank-you for choosing [place] for your healthcare needs today.  YOU HAD AN ENDOSCOPIC PROCEDURE TODAY AT [facility]:   Refer to the procedure report that was given to you for any specific questions about what was found during the examination.  If the procedure report does not answer your questions, please call your gastroenterologist to clarify.  If you requested that your care partner not be given the details of your procedure findings, then the procedure report has been included in a sealed envelope for you to review at your convenience later.  YOU SHOULD EXPECT: Some feelings of bloating in the abdomen. Passage of more gas than usual.  Walking can help get rid of the air that was put into your GI tract during the procedure and reduce the bloating. If you had a lower endoscopy (such as a colonoscopy or flexible sigmoidoscopy) you may notice spotting of blood in your stool or on the toilet paper. If you underwent a bowel prep for your procedure, you may not have a normal bowel movement for a few days.  Please Note:  You might notice some irritation and congestion in your nose or some drainage.  This is from the oxygen used during your procedure.  There is no need for concern and it should clear up in a day or so.  SYMPTOMS TO REPORT IMMEDIATELY:   Following lower endoscopy (colonoscopy or flexible sigmoidoscopy):  Excessive amounts of blood in the stool  Significant tenderness or worsening of abdominal pains  Swelling of the abdomen that is new, acute  Fever of 100F or higher   For urgent or emergent issues, a gastroenterologist can be reached at any hour by calling [phone]. Do not use MyChart messaging for urgent concerns.    DIET:  We do recommend a small meal at first, but then you may proceed to your regular diet.  Drink plenty of fluids but you should avoid alcoholic beverages for 24  hours.  ACTIVITY:  You should plan to take it easy for the rest of today and you should NOT DRIVE or use heavy machinery until tomorrow (because of the sedation medicines used during the test).    FOLLOW UP: Our staff will call the number listed on your records 48-72 hours following your procedure to check on you and address any questions or concerns that you may have regarding the information given to you following your procedure. If we do not reach you, we will leave a message.  We will attempt to reach you two times.  During this call, we will ask if you have developed any symptoms of COVID 19. If you develop any symptoms (ie: fever, flu-like symptoms, shortness of breath, cough etc.) before then, please call [phone].  If you test positive for Covid 19 in the 2 weeks post procedure, please call and report this information to [place].    If any biopsies were taken you will be contacted by phone or by letter within the next 1-3 weeks.  Please call us at [phone] if you have not heard about the biopsies in 3 weeks.    SIGNATURES/CONFIDENTIALITY: You and/or your care partner have signed paperwork which will be entered into your electronic medical record.  These signatures attest to the fact that that the information above on your After Visit Summary has been reviewed and is understood.  Full responsibility of the confidentiality of this discharge information  lies with you and/or your care-partner.

## 2020-05-30 NOTE — Op Note (Signed)
[facility] Patient Name: Micheal Doyle Procedure Date: 05/30/2020 10:24 AM MRN: [id number] Endoscopist: Remo Lipps P. Havery Moros , MD Age: 64 Referring MD:  Date of Birth: 07/27/1956 Gender: Male Account #: [id number] Procedure:                Colonoscopy Indications:              High risk colon cancer surveillance: Personal                            history of colonic polyps (multiple polyps removed                            in 2014) Medicines:                Monitored Anesthesia Care Procedure:                Pre-Anesthesia Assessment:                           - Prior to the procedure, a History and Physical                            was performed, and patient medications and                            allergies were reviewed. The patient's tolerance of                            previous anesthesia was also reviewed. The risks                            and benefits of the procedure and the sedation                            options and risks were discussed with the patient.                            All questions were answered, and informed consent                            was obtained. Prior Anticoagulants: The patient has                            taken no previous anticoagulant or antiplatelet                            agents. ASA Grade Assessment: II - A patient with                            mild systemic disease. After reviewing the risks                            and benefits, the patient was deemed in  satisfactory condition to undergo the procedure.                           After obtaining informed consent, the colonoscope                            was passed under direct vision. Throughout the                            procedure, the patient's blood pressure, pulse, and                            oxygen saturations were monitored continuously. The                            Colonoscope was introduced through the anus and                             advanced to the the cecum, identified by                            appendiceal orifice and ileocecal valve. The                            colonoscopy was performed without difficulty. The                            patient tolerated the procedure well. The quality                            of the bowel preparation was good. The ileocecal                            valve, appendiceal orifice, and rectum were                            photographed. Scope In: 10:38:36 AM Scope Out: 10:57:38 AM Scope Withdrawal Time: 0 hours 17 minutes 4 seconds  Total Procedure Duration: 0 hours 19 minutes 2 seconds  Findings:                 The perianal and digital rectal examinations were                            normal.                           Two flat polyps were found in the cecum. The polyps                            were 2 to 8 mm in size. These polyps were removed                            with a cold snare. Resection and retrieval were  complete.                           Two sessile polyps were found in the transverse                            colon. The polyps were 4 mm in size. These polyps                            were removed with a cold snare. Resection and                            retrieval were complete.                           Two sessile polyps were found in the recto-sigmoid                            colon. The polyps were 3 to 4 mm in size. These                            polyps were removed with a cold snare. Resection                            and retrieval were complete.                           Internal hemorrhoids were found during retroflexion.                           The exam was otherwise without abnormality. Complications:            No immediate complications. Estimated blood loss:                            Minimal. Estimated Blood Loss:     Estimated blood loss was minimal. Impression:                - Two 2 to 8 mm polyps in the cecum, removed with a                            cold snare. Resected and retrieved.                           - Two 4 mm polyps in the transverse colon, removed                            with a cold snare. Resected and retrieved.                           - Two 3 to 4 mm polyps at the recto-sigmoid colon,                            removed with a cold snare. Resected and retrieved.                           -  Internal hemorrhoids.                           - The examination was otherwise normal. Recommendation:           - Patient has a contact number available for                            emergencies. The signs and symptoms of potential                            delayed complications were discussed with the                            patient. Return to normal activities tomorrow.                            Written discharge instructions were provided to the                            patient.                           - Resume previous diet.                           - Continue present medications.                           - Await pathology results. Remo Lipps P. Tuleen Mandelbaum, MD 05/30/2020 11:05:02 AM This report has been signed electronically.

## 2020-05-30 NOTE — Progress Notes (Signed)
Called to room to assist during endoscopic procedure.  Patient ID and intended procedure confirmed with present staff. Received instructions for my participation in the procedure from the performing physician.  

## 2020-05-30 NOTE — Progress Notes (Signed)
PT taken to PACU. Monitors in place. VSS. Report given to RN. 

## 2020-06-01 ENCOUNTER — Telehealth: Payer: Self-pay | Admitting: *Deleted

## 2020-06-01 NOTE — Telephone Encounter (Signed)
°  Follow up Call-  Call back number 05/30/2020  Post procedure Call Back phone  # [phone]  Permission to leave phone message Yes  Some recent data might be hidden     Patient questions:  Do you have a fever, pain , or abdominal swelling? No. Pain Score  0 *  Have you tolerated food without any problems? Yes.    Have you been able to return to your normal activities? Yes.    Do you have any questions about your discharge instructions: Diet   No. Medications  No. Follow up visit  No.  Do you have questions or concerns about your Care? No.  Actions: * If pain score is 4 or above: No action needed, pain <4.  1. Have you developed a fever since your procedure? no  2.   Have you had an respiratory symptoms (SOB or cough) since your procedure? no  3.   Have you tested positive for COVID 19 since your procedure no  4.   Have you had any family members/close contacts diagnosed with the COVID 19 since your procedure?  no   If yes to any of these questions please route to Joylene Reyan, RN and Joella Prince, RN

## 2020-06-06 ENCOUNTER — Encounter: Payer: Self-pay | Admitting: Gastroenterology

## 2020-08-08 ENCOUNTER — Other Ambulatory Visit: Payer: Self-pay | Admitting: Internal Medicine

## 2020-08-08 DIAGNOSIS — R1900 Intra-abdominal and pelvic swelling, mass and lump, unspecified site: Secondary | ICD-10-CM

## 2020-08-08 DIAGNOSIS — D7389 Other diseases of spleen: Secondary | ICD-10-CM | POA: Insufficient documentation

## 2020-08-08 DIAGNOSIS — K769 Liver disease, unspecified: Secondary | ICD-10-CM | POA: Insufficient documentation

## 2020-08-31 ENCOUNTER — Ambulatory Visit
Admission: RE | Admit: 2020-08-31 | Discharge: 2020-08-31 | Disposition: A | Discharge status: Home / Self Care | Payer: 59 | Source: Ambulatory Visit | Attending: Internal Medicine | Admitting: Internal Medicine

## 2020-08-31 ENCOUNTER — Other Ambulatory Visit: Payer: Self-pay

## 2020-08-31 DIAGNOSIS — K769 Liver disease, unspecified: Secondary | ICD-10-CM

## 2020-08-31 DIAGNOSIS — R1900 Intra-abdominal and pelvic swelling, mass and lump, unspecified site: Secondary | ICD-10-CM

## 2020-08-31 DIAGNOSIS — D7389 Other diseases of spleen: Secondary | ICD-10-CM

## 2020-08-31 MED ORDER — GADOBENATE DIMEGLUMINE 529 MG/ML IV SOLN
17.0000 mL | Freq: Once | INTRAVENOUS | Status: AC | PRN
  Administered 2020-08-31: 17 mL via INTRAVENOUS

## 2020-08-31 MED ORDER — GADOBUTROL 1 MMOL/ML IV SOLN: 17.0000 mL | Freq: Once | INTRAVENOUS | Status: DC | PRN

## 2020-09-01 ENCOUNTER — Other Ambulatory Visit: Payer: Self-pay

## 2020-09-01 DIAGNOSIS — E785 Hyperlipidemia, unspecified: Secondary | ICD-10-CM

## 2020-09-01 MED ORDER — ROSUVASTATIN CALCIUM 5 MG PO TABS: 5.0000 mg | ORAL_TABLET | Freq: Every day | ORAL | 1 refills | Status: DC

## 2020-09-20 ENCOUNTER — Other Ambulatory Visit: Payer: Self-pay | Admitting: Internal Medicine

## 2020-09-20 DIAGNOSIS — R911 Solitary pulmonary nodule: Secondary | ICD-10-CM

## 2020-10-05 ENCOUNTER — Ambulatory Visit
Admission: RE | Admit: 2020-10-05 | Discharge: 2020-10-05 | Disposition: A | Discharge status: Home / Self Care | Payer: 59 | Source: Ambulatory Visit | Attending: Internal Medicine | Admitting: Internal Medicine

## 2020-10-05 DIAGNOSIS — R911 Solitary pulmonary nodule: Secondary | ICD-10-CM

## 2021-03-30 ENCOUNTER — Other Ambulatory Visit: Payer: Self-pay | Admitting: Internal Medicine

## 2021-03-30 DIAGNOSIS — E785 Hyperlipidemia, unspecified: Secondary | ICD-10-CM

## 2021-07-17 ENCOUNTER — Other Ambulatory Visit: Payer: Self-pay

## 2021-07-17 ENCOUNTER — Ambulatory Visit (INDEPENDENT_AMBULATORY_CARE_PROVIDER_SITE_OTHER): Payer: Medicare Other | Admitting: Internal Medicine

## 2021-07-17 ENCOUNTER — Encounter: Payer: Self-pay | Admitting: Internal Medicine

## 2021-07-17 VITALS — BP 134/76 | HR 87 | Temp 98.4°F | Ht 70.0 in | Wt 188.0 lb

## 2021-07-17 DIAGNOSIS — B0230 Zoster ocular disease, unspecified: Secondary | ICD-10-CM | POA: Insufficient documentation

## 2021-07-17 DIAGNOSIS — B182 Chronic viral hepatitis C: Secondary | ICD-10-CM | POA: Diagnosis not present

## 2021-07-17 DIAGNOSIS — N400 Enlarged prostate without lower urinary tract symptoms: Secondary | ICD-10-CM | POA: Insufficient documentation

## 2021-07-17 DIAGNOSIS — Z8619 Personal history of other infectious and parasitic diseases: Secondary | ICD-10-CM | POA: Insufficient documentation

## 2021-07-17 DIAGNOSIS — Z23 Encounter for immunization: Secondary | ICD-10-CM

## 2021-07-17 DIAGNOSIS — E785 Hyperlipidemia, unspecified: Secondary | ICD-10-CM | POA: Diagnosis not present

## 2021-07-17 DIAGNOSIS — E519 Thiamine deficiency, unspecified: Secondary | ICD-10-CM | POA: Diagnosis not present

## 2021-07-17 DIAGNOSIS — R911 Solitary pulmonary nodule: Secondary | ICD-10-CM

## 2021-07-17 DIAGNOSIS — L989 Disorder of the skin and subcutaneous tissue, unspecified: Secondary | ICD-10-CM | POA: Insufficient documentation

## 2021-07-17 LAB — LIPID PANEL
Cholesterol: 151 mg/dL (ref 0–200)
HDL: 36.1 mg/dL — ABNORMAL LOW (ref >39.00)
NonHDL: 115.11
Total CHOL/HDL Ratio: 4
Triglycerides: 232 mg/dL — ABNORMAL HIGH (ref 0.0–149.0)
VLDL: 46.4 mg/dL — ABNORMAL HIGH (ref 0.0–40.0)

## 2021-07-17 LAB — CBC WITH DIFFERENTIAL/PLATELET
Basophils Absolute: 0 10*3/uL (ref 0.0–0.1)
Basophils Relative: 0.8 % (ref 0.0–3.0)
Eosinophils Absolute: 0.1 10*3/uL (ref 0.0–0.7)
Eosinophils Relative: 2.2 % (ref 0.0–5.0)
HCT: 39.7 % (ref 39.0–52.0)
Hemoglobin: 13.9 g/dL (ref 13.0–17.0)
Lymphocytes Relative: 40.4 % (ref 12.0–46.0)
Lymphs Abs: 2.4 10*3/uL (ref 0.7–4.0)
MCHC: 34.9 g/dL (ref 30.0–36.0)
MCV: 100.4 fl — ABNORMAL HIGH (ref 78.0–100.0)
Monocytes Absolute: 0.3 10*3/uL (ref 0.1–1.0)
Monocytes Relative: 5.5 % (ref 3.0–12.0)
Neutro Abs: 3 10*3/uL (ref 1.4–7.7)
Neutrophils Relative %: 51.1 % (ref 43.0–77.0)
Platelets: 280 10*3/uL (ref 150.0–400.0)
RBC: 3.95 Mil/uL — ABNORMAL LOW (ref 4.22–5.81)
RDW: 13.7 % (ref 11.5–15.5)
WBC: 5.9 10*3/uL (ref 4.0–10.5)

## 2021-07-17 LAB — BASIC METABOLIC PANEL
BUN: 14 mg/dL (ref 6–23)
CO2: 28 mEq/L (ref 19–32)
Calcium: 9.7 mg/dL (ref 8.4–10.5)
Chloride: 104 mEq/L (ref 96–112)
Creatinine, Ser: 0.77 mg/dL (ref 0.40–1.50)
GFR: 94.01 mL/min (ref >60.00)
Glucose, Bld: 96 mg/dL (ref 70–99)
Potassium: 4.4 mEq/L (ref 3.5–5.1)
Sodium: 140 mEq/L (ref 135–145)

## 2021-07-17 LAB — HEPATIC FUNCTION PANEL
ALT: 22 U/L (ref 0–53)
AST: 15 U/L (ref 0–37)
Albumin: 4.5 g/dL (ref 3.5–5.2)
Alkaline Phosphatase: 53 U/L (ref 39–117)
Bilirubin, Direct: 0.1 mg/dL (ref 0.0–0.3)
Total Bilirubin: 0.6 mg/dL (ref 0.2–1.2)
Total Protein: 7.1 g/dL (ref 6.0–8.3)

## 2021-07-17 LAB — PSA: PSA: 0.21 ng/mL (ref 0.10–4.00)

## 2021-07-17 LAB — TSH: TSH: 3.59 u[IU]/mL (ref 0.35–5.50)

## 2021-07-17 MED ORDER — SHINGRIX 50 MCG/0.5ML IM SUSR: 0.5000 mL | Freq: Once | INTRAMUSCULAR | 1 refills | Status: AC

## 2021-07-17 MED ORDER — VALACYCLOVIR HCL 500 MG PO TABS: 500.0000 mg | ORAL_TABLET | Freq: Every day | ORAL | 0 refills | Status: DC

## 2021-07-17 NOTE — Patient Instructions (Signed)

## 2021-07-17 NOTE — Progress Notes (Signed)
Subjective:  Patient ID: Micheal Doyle, male    DOB: [birth year]  Age: 65 y.o. MRN: [id number]  CC: Hyperlipidemia  This visit occurred during the SARS-CoV-2 public health emergency.  Safety protocols were in place, including screening questions prior to the visit, additional usage of staff PPE, and extensive cleaning of exam room while observing appropriate contact time as indicated for disinfecting solutions.    HPI Micheal Doyle presents for f/up -  He stopped taking the statin because it caused muscle aches and fatigue.  Those symptoms have resolved.  He is very active and denies chest pain, shortness of breath, diaphoresis, palpitations, or edema.  Outpatient Medications Prior to Visit  Medication Sig Dispense Refill   Avanafil (STENDRA) 100 MG TABS Take 1 tablet by mouth daily as needed. 10 tablet 5   Rimegepant Sulfate (NURTEC) 75 MG TBDP Take 1 tablet by mouth every other day. 16 tablet 5   thiamine (VITAMIN B-1) 50 MG tablet Take 1 tablet (50 mg total) by mouth every other day. 45 tablet 1   rosuvastatin (CRESTOR) 5 MG tablet Take 1 tablet (5 mg total) by mouth daily. 90 tablet 1   valACYclovir (VALTREX) 1000 MG tablet Take 1 tablet (1,000 mg total) by mouth 3 (three) times daily. 21 tablet 1   No facility-administered medications prior to visit.    ROS Review of Systems  Constitutional:  Negative for diaphoresis, fatigue and fever.  HENT: Negative.    Eyes: Negative.   Respiratory:  Negative for cough, chest tightness, shortness of breath and wheezing.   Cardiovascular:  Negative for chest pain, palpitations and leg swelling.  Gastrointestinal:  Negative for abdominal pain, constipation, diarrhea, nausea and vomiting.  Endocrine: Negative.   Genitourinary: Negative.  Negative for difficulty urinating.  Musculoskeletal:  Negative for arthralgias.  Skin: Negative.  Negative for color change.  Neurological:  Negative for dizziness, weakness and light-headedness.   Hematological:  Negative for adenopathy. Does not bruise/bleed easily.  Psychiatric/Behavioral: Negative.     Objective:  BP 134/76 (BP Location: Right Arm, Patient Position: Sitting, Cuff Size: Large)   Pulse 87   Temp 98.4 F (36.9 C) (Oral)   Ht 5\' 10"  (1.778 m)   Wt 188 lb (85.3 kg)   SpO2 97%   BMI 26.98 kg/m   BP Readings from Last 3 Encounters:  07/17/21 134/76  05/30/20 120/80  03/22/20 110/68    Wt Readings from Last 3 Encounters:  07/17/21 188 lb (85.3 kg)  05/30/20 184 lb 9.6 oz (83.7 kg)  05/17/20 184 lb 9.6 oz (83.7 kg)    Physical Exam Vitals reviewed.  HENT:     Nose: Nose normal.     Mouth/Throat:     Mouth: Mucous membranes are moist.  Eyes:     General: No scleral icterus.    Conjunctiva/sclera: Conjunctivae normal.  Cardiovascular:     Rate and Rhythm: Normal rate and regular rhythm.     Heart sounds: No murmur heard. Pulmonary:     Effort: Pulmonary effort is normal.     Breath sounds: No stridor. No wheezing, rhonchi or rales.  Abdominal:     General: Abdomen is flat.     Palpations: There is no mass.     Tenderness: There is no abdominal tenderness. There is no guarding.     Hernia: No hernia is present. There is no hernia in the left inguinal area or right inguinal area.  Genitourinary:    Pubic Area: No rash.  Penis: Normal and circumcised.      Testes: Normal.     Epididymis:     Right: Normal.     Left: Normal.     Prostate: Enlarged. Not tender and no nodules present.     Rectum: Normal. Guaiac result negative. No mass, tenderness, anal fissure, external hemorrhoid or internal hemorrhoid. Normal anal tone.  Musculoskeletal:        General: Normal range of motion.     Cervical back: Neck supple.     Right lower leg: No edema.     Left lower leg: No edema.  Lymphadenopathy:     Cervical: No cervical adenopathy.     Lower Body: No right inguinal adenopathy. No left inguinal adenopathy.  Skin:    Findings: Lesion present.   Neurological:     General: No focal deficit present.     Mental Status: He is alert and oriented to person, place, and time.  Psychiatric:        Mood and Affect: Mood normal.        Behavior: Behavior normal.    Lab Results  Component Value Date   WBC 5.9 07/17/2021   HGB 13.9 07/17/2021   HCT 39.7 07/17/2021   PLT 280.0 07/17/2021   GLUCOSE 96 07/17/2021   CHOL 151 07/17/2021   TRIG 232.0 (H) 07/17/2021   HDL 36.10 (L) 07/17/2021   LDLDIRECT 96.0 07/17/2021   LDLCALC 111 (H) 03/03/2020   ALT 22 07/17/2021   AST 15 07/17/2021   NA 140 07/17/2021   K 4.4 07/17/2021   CL 104 07/17/2021   CREATININE 0.77 07/17/2021   BUN 14 07/17/2021   CO2 28 07/17/2021   TSH 3.59 07/17/2021   PSA 0.21 07/17/2021   INR 1.0 04/11/2016   HGBA1C 5.8 (H) 02/12/2020    CT Chest Wo Contrast  Result Date: 10/06/2020 CLINICAL DATA:  Follow-up pulmonary nodule. EXAM: CT CHEST WITHOUT CONTRAST TECHNIQUE: Multidetector CT imaging of the chest was performed following the standard protocol without IV contrast. COMPARISON:  03/15/2020 FINDINGS: Cardiovascular: The heart size is normal. Aortic atherosclerosis. Coronary artery atherosclerotic calcification. No pericardial effusion. Mediastinum/Nodes: No enlarged mediastinal or axillary lymph nodes. Thyroid gland, trachea, and esophagus demonstrate no significant findings. Lungs/Pleura: Paraseptal and centrilobular emphysema. Left lower lobe lung nodule measures 4 mm, image 137/5. This is unchanged compared with previous exam. Stable 4 mm lingular nodule, image 129/5. 4 mm perifissural nodule in the left midlung is stable, image 105/5. No new or enlarging nodules. Upper Abdomen: Similar appearance of mildly nodular liver contour compatible with cirrhosis. No acute findings within the imaged portions of the upper abdomen. Musculoskeletal: No chest wall mass or suspicious bone lesions identified. Degenerative changes are noted within the lower thoracic spine. Stable  mild superior endplate compression deformity involving the T11 vertebra. IMPRESSION: 1. Stable small pulmonary nodules within the left lung measuring up to 4 mm. No follow-up needed if patient is low-risk (and has no known or suspected primary neoplasm). Non-contrast chest CT can be considered at 12 months (from 03/15/2020) if patient is high-risk. This recommendation follows the consensus statement: Guidelines for Management of Incidental Pulmonary Nodules Detected on CT Images: From the Fleischner Society 2017; Radiology 2017; 284:228-243. 2. Coronary artery calcifications noted. 3. Morphologic features of the liver compatible with cirrhosis. Aortic Atherosclerosis (ICD10-I70.0) and Emphysema (ICD10-J43.9). Electronically Signed   By: Kerby Moors M.D.   On: 10/06/2020 14:44    Assessment & Plan:   Micheal Doyle was seen today for hyperlipidemia.  Diagnoses and all orders for this visit:  Flu vaccine need -     Flu Vaccine QUAD High Dose(Fluad)  Chronic hepatitis C without hepatic coma ([place])- His LFTs and AFP are normal now. -     Basic metabolic panel; Future -     Hepatic function panel; Future -     AFP tumor marker; Future -     AFP tumor marker -     Hepatic function panel -     Basic metabolic panel  Thiamine deficiency- I will monitor his thiamine level. -     CBC with Differential/Platelet; Future -     Vitamin B1; Future -     Vitamin B1 -     CBC with Differential/Platelet  Solitary pulmonary nodule  Hyperlipidemia LDL goal <100- He is not willing to take a statin. -     Lipid panel; Future -     TSH; Future -     Hepatic function panel; Future -     Hepatic function panel -     TSH -     Lipid panel  Benign prostatic hyperplasia without lower urinary tract symptoms- His PSA is reassuringly low. 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  Need for shingles vaccine -     Zoster Vaccine Adjuvanted [facility]) injection; Inject 0.5 mLs into the muscle once for 1 dose.  Skin lesion of left upper extremity -     Ambulatory referral to Dermatology  Need for vaccination -     Pneumococcal conjugate vaccine 20-valent (Prevnar 20)  Other orders -     LDL cholesterol, direct  I have discontinued Micheal Doyle's valACYclovir and rosuvastatin. I am also having him start on valACYclovir and Shingrix. Additionally, I am having him maintain his Stendra, thiamine, and Nurtec.  Meds ordered this encounter  Medications   valACYclovir (VALTREX) 500 MG tablet    Sig: Take 1 tablet (500 mg total) by mouth daily.    Dispense:  90 tablet    Refill:  0   Zoster Vaccine Adjuvanted [facility]) injection    Sig: Inject 0.5 mLs into the muscle once for 1 dose.    Dispense:  0.5 mL    Refill:  1      Follow-up: Return in about 6 months (around 01/15/2022).  Scarlette Calico, MD

## 2021-07-18 LAB — LDL CHOLESTEROL, DIRECT: Direct LDL: 96 mg/dL

## 2021-07-18 LAB — AFP TUMOR MARKER: AFP-Tumor Marker: 2.6 ng/mL (ref <6.1)

## 2021-07-22 LAB — VITAMIN B1: Vitamin B1 (Thiamine): 19 nmol/L (ref 8–30)

## 2021-07-24 ENCOUNTER — Encounter: Payer: Self-pay | Admitting: Internal Medicine

## 2021-07-31 ENCOUNTER — Other Ambulatory Visit: Payer: Self-pay | Admitting: Internal Medicine

## 2021-07-31 DIAGNOSIS — B182 Chronic viral hepatitis C: Secondary | ICD-10-CM

## 2021-07-31 DIAGNOSIS — D7389 Other diseases of spleen: Secondary | ICD-10-CM | POA: Insufficient documentation

## 2021-07-31 DIAGNOSIS — K769 Liver disease, unspecified: Secondary | ICD-10-CM | POA: Insufficient documentation

## 2021-08-21 ENCOUNTER — Telehealth: Payer: Self-pay

## 2021-08-21 NOTE — Telephone Encounter (Signed)
Pt calling in requesting a refill for: Avanafil (STENDRA) 100 MG TABS  Pt wanting the refill sent to: [facility], [place]  Phone: [phone] Fax:  [phone]  LOV 07/17/21

## 2021-08-22 ENCOUNTER — Other Ambulatory Visit: Payer: Self-pay | Admitting: Internal Medicine

## 2021-08-22 DIAGNOSIS — N5201 Erectile dysfunction due to arterial insufficiency: Secondary | ICD-10-CM

## 2021-08-22 MED ORDER — STENDRA 100 MG PO TABS: 1.0000 | ORAL_TABLET | Freq: Every day | ORAL | 5 refills | Status: DC | PRN

## 2021-08-25 ENCOUNTER — Telehealth: Payer: Self-pay | Admitting: Internal Medicine

## 2021-08-25 NOTE — Telephone Encounter (Signed)
Pharmacy called about PT's subscription of Stendra 100 mg. Their program does not cover the Stendra 100 mg but they do cover the 200 mg. They would like to know if the 200 mg would be fine for the PT.   Their number is [phone]

## 2021-08-26 ENCOUNTER — Other Ambulatory Visit: Payer: Medicare Other

## 2021-08-30 ENCOUNTER — Telehealth: Payer: Self-pay | Admitting: Internal Medicine

## 2021-08-30 DIAGNOSIS — N5201 Erectile dysfunction due to arterial insufficiency: Secondary | ICD-10-CM

## 2021-08-30 MED ORDER — STENDRA 100 MG PO TABS: 1.0000 | ORAL_TABLET | Freq: Every day | ORAL | 5 refills | Status: DC | PRN

## 2021-08-30 NOTE — Telephone Encounter (Signed)
Requesting verbals/ new script to be sent, as they only dispense 200 MG of medication, and it was prescribed at 100 MG for Avanafil (STENDRA) 100 MG TABS.  Please send over new script   [facility]  [place], [place]

## 2021-08-30 NOTE — Telephone Encounter (Signed)
Rx sent 

## 2021-09-08 ENCOUNTER — Ambulatory Visit
Admission: RE | Admit: 2021-09-08 | Discharge: 2021-09-08 | Disposition: A | Discharge status: Home / Self Care | Payer: Medicare Other | Source: Ambulatory Visit | Attending: Internal Medicine | Admitting: Internal Medicine

## 2021-09-08 DIAGNOSIS — B182 Chronic viral hepatitis C: Secondary | ICD-10-CM

## 2021-09-08 DIAGNOSIS — D7389 Other diseases of spleen: Secondary | ICD-10-CM

## 2021-09-08 DIAGNOSIS — K769 Liver disease, unspecified: Secondary | ICD-10-CM

## 2021-09-08 MED ORDER — GADOBENATE DIMEGLUMINE 529 MG/ML IV SOLN
18.0000 mL | Freq: Once | INTRAVENOUS | Status: AC | PRN
  Administered 2021-09-08: 18 mL via INTRAVENOUS

## 2021-09-10 ENCOUNTER — Encounter: Payer: Self-pay | Admitting: Internal Medicine

## 2021-10-12 ENCOUNTER — Telehealth: Payer: Self-pay | Admitting: Internal Medicine

## 2021-10-12 NOTE — Telephone Encounter (Signed)
Jessica from pharmacy called to get claification on if it's okay to switch Avanafil (STENDRA) 100 MG TABS to 200 MG.  ? ?Please advise.  ?

## 2021-10-12 NOTE — Telephone Encounter (Signed)
Verbal order given to change from '100mg'$  tabs to '200mg'$ s to pharmacist at [place].  ?

## 2021-11-15 ENCOUNTER — Telehealth: Payer: Self-pay

## 2021-11-15 ENCOUNTER — Other Ambulatory Visit: Payer: Self-pay | Admitting: Internal Medicine

## 2021-11-15 DIAGNOSIS — N5201 Erectile dysfunction due to arterial insufficiency: Secondary | ICD-10-CM

## 2021-11-15 MED ORDER — STENDRA 100 MG PO TABS: 1.0000 | ORAL_TABLET | Freq: Every day | ORAL | 5 refills | Status: DC | PRN

## 2021-11-15 NOTE — Telephone Encounter (Signed)
Pt is requesting a refill on: ?Avanafil (STENDRA) 100 MG TABS ? ?Pharmacy: ?[id number] - [place], [address] ? ?LOV 07/17/21 ?ROV 01/09/22 ?

## 2022-01-09 ENCOUNTER — Ambulatory Visit (INDEPENDENT_AMBULATORY_CARE_PROVIDER_SITE_OTHER): Payer: Medicare Other | Admitting: Internal Medicine

## 2022-01-09 ENCOUNTER — Encounter: Payer: Self-pay | Admitting: Internal Medicine

## 2022-01-09 VITALS — BP 124/76 | HR 75 | Temp 98.2°F | Ht 70.0 in | Wt 184.0 lb

## 2022-01-09 DIAGNOSIS — N5201 Erectile dysfunction due to arterial insufficiency: Secondary | ICD-10-CM | POA: Diagnosis not present

## 2022-01-09 MED ORDER — TADALAFIL 10 MG PO TABS: 10.0000 mg | ORAL_TABLET | Freq: Every day | ORAL | 2 refills | Status: DC | PRN

## 2022-01-09 NOTE — Progress Notes (Signed)
Subjective:  Patient ID: Micheal Doyle, male    DOB: September 01, 1955  Age: 66 y.o. MRN: [id number]  CC: Follow-up   HPI CASMIR AUGUSTE presents for f/up-  He wants to try a different med for ED - the current one is too expensive. He is active and denies DOE, CP, SOB, edema.  Outpatient Medications Prior to Visit  Medication Sig Dispense Refill   Rimegepant Sulfate (NURTEC) 75 MG TBDP Take 1 tablet by mouth every other day. 16 tablet 5   thiamine (VITAMIN B-1) 50 MG tablet Take 1 tablet (50 mg total) by mouth every other day. 45 tablet 1   valACYclovir (VALTREX) 500 MG tablet Take 1 tablet (500 mg total) by mouth daily. 90 tablet 0   Avanafil (STENDRA) 100 MG TABS Take 1 tablet by mouth daily as needed. 10 tablet 5   No facility-administered medications prior to visit.    ROS Review of Systems  Constitutional:  Negative for chills, diaphoresis, fatigue and fever.  HENT: Negative.    Eyes: Negative.   Respiratory:  Negative for cough, chest tightness and wheezing.   Cardiovascular:  Negative for chest pain, palpitations and leg swelling.  Gastrointestinal:  Negative for abdominal pain, constipation, diarrhea, nausea and vomiting.  Endocrine: Negative.   Genitourinary: Negative.  Negative for difficulty urinating.       ++ED  Musculoskeletal: Negative.  Negative for arthralgias and myalgias.  Skin: Negative.   Neurological:  Negative for dizziness, weakness and headaches.  Hematological:  Negative for adenopathy. Does not bruise/bleed easily.  Psychiatric/Behavioral: Negative.      Objective:  BP 124/76 (BP Location: Right Arm, Patient Position: Sitting, Cuff Size: Large)   Pulse 75   Temp 98.2 F (36.8 C) (Oral)   Ht '5\' 10"'$  (1.778 m)   Wt 184 lb (83.5 kg)   SpO2 94%   BMI 26.40 kg/m   BP Readings from Last 3 Encounters:  01/09/22 124/76  07/17/21 134/76  05/30/20 120/80    Wt Readings from Last 3 Encounters:  01/09/22 184 lb (83.5 kg)  07/17/21 188 lb (85.3 kg)   05/30/20 184 lb 9.6 oz (83.7 kg)    Physical Exam Vitals reviewed.  HENT:     Nose: Nose normal.     Mouth/Throat:     Mouth: Mucous membranes are moist.  Eyes:     General: No scleral icterus.    Conjunctiva/sclera: Conjunctivae normal.  Cardiovascular:     Rate and Rhythm: Normal rate and regular rhythm.     Heart sounds: No murmur heard. Pulmonary:     Effort: Pulmonary effort is normal.     Breath sounds: No stridor. No wheezing, rhonchi or rales.  Abdominal:     General: Abdomen is flat.     Palpations: There is no mass.     Tenderness: There is no abdominal tenderness. There is no guarding.     Hernia: No hernia is present.  Musculoskeletal:        General: Normal range of motion.     Cervical back: Neck supple.     Right lower leg: No edema.     Left lower leg: No edema.  Lymphadenopathy:     Cervical: No cervical adenopathy.  Skin:    General: Skin is warm and dry.  Neurological:     General: No focal deficit present.     Mental Status: He is alert.  Psychiatric:        Mood and Affect: Mood normal.  Lab Results  Component Value Date   WBC 5.9 07/17/2021   HGB 13.9 07/17/2021   HCT 39.7 07/17/2021   PLT 280.0 07/17/2021   GLUCOSE 96 07/17/2021   CHOL 151 07/17/2021   TRIG 232.0 (H) 07/17/2021   HDL 36.10 (L) 07/17/2021   LDLDIRECT 96.0 07/17/2021   LDLCALC 111 (H) 03/03/2020   ALT 22 07/17/2021   AST 15 07/17/2021   NA 140 07/17/2021   K 4.4 07/17/2021   CL 104 07/17/2021   CREATININE 0.77 07/17/2021   BUN 14 07/17/2021   CO2 28 07/17/2021   TSH 3.59 07/17/2021   PSA 0.21 07/17/2021   INR 1.0 04/11/2016   HGBA1C 5.8 (H) 02/12/2020    MR Abdomen W Wo Contrast  Result Date: 09/10/2021 CLINICAL DATA:  Follow-up liver lesion. EXAM: MRI ABDOMEN WITHOUT AND WITH CONTRAST TECHNIQUE: Multiplanar multisequence MR imaging of the abdomen was performed both before and after the administration of intravenous contrast. CONTRAST:  37m MULTIHANCE  GADOBENATE DIMEGLUMINE 529 MG/ML IV SOLN COMPARISON:  08/31/2020 FINDINGS: Lower chest: No acute findings. Hepatobiliary: Cyst within segment 3 of left lobe of liver containing a few thin internal areas of septation measures 2.2 by 1.5 cm. This is unchanged compared with 04/14/20 compatible with a benign liver cyst. No significant internal enhancement associated with this structure. No new liver lesions. The gallbladder appears normal. No bile duct dilatation. Pancreas: No mass, inflammatory changes, or other parenchymal abnormality identified. Spleen: Multiple small T2 hyperintense foci are again noted scattered throughout both lobes of liver. The largest of these measures 1.1 cm, image 17/7. Allowing for differences in technique these do not appear significantly changed in size or multiplicity. On the early postcontrast T1 weighted images these lesions are predominantly hypointense. On the delayed images there is contrast fill-in within these lesions compatible with multiple benign hemangiomas. Adrenals/Urinary Tract: Normal adrenal glands. Bilateral upper pole kidney cysts are stable from the previous exam. No suspicious enhancing kidney lesions identified. Stomach/Bowel: Visualized portions within the abdomen are unremarkable. Vascular/Lymphatic: Aortic atherosclerosis. No aneurysm. Prominent upper abdominal lymph nodes are unchanged when compared with the previous exam. For example, within the porta hepatic region there is a stable lymph node measuring 1.1 cm, image 16/7. Adjacent lymph node measures 1.3 cm, image 15/7 and is also unchanged. No enlarged retroperitoneal or mesenteric lymph nodes. Other:  No free fluid or fluid collections. Musculoskeletal: No suspicious bone lesions identified. IMPRESSION: 1. No acute findings within the abdomen. 2. Stable appearance of multiple small T2 hyperintense foci scattered throughout both lobes of liver compatible with enhancement characteristics consistent with multiple  benign hemangiomas. 3. Unchanged appearance of septated cyst within segment 3 of the liver. 4. Stable appearance of bilateral upper pole kidney cysts. Electronically Signed   By: TKerby MoorsM.D.   On: 09/10/2021 16:16    Assessment & Plan:   JHosamwas seen today for follow-up.  Diagnoses and all orders for this visit:  Erectile dysfunction due to arterial insufficiency -     tadalafil (CIALIS) 10 MG tablet; Take 1 tablet (10 mg total) by mouth daily as needed for erectile dysfunction.   I have discontinued Jaxen B. Fanfan's Stendra. I am also having him start on tadalafil. Additionally, I am having him maintain his thiamine, Nurtec, and valACYclovir.  Meds ordered this encounter  Medications   tadalafil (CIALIS) 10 MG tablet    Sig: Take 1 tablet (10 mg total) by mouth daily as needed for erectile dysfunction.    Dispense:  10 tablet    Refill:  2     Follow-up: Return in about 6 months (around 07/11/2022).  Scarlette Calico, MD

## 2022-01-09 NOTE — Patient Instructions (Signed)
Erectile Dysfunction ?Erectile dysfunction (ED) is the inability to get or keep an erection in order to have sexual intercourse. ED is considered a symptom of an underlying disorder and is not considered a disease. ED may include: ?Inability to get an erection. ?Lack of enough hardness of the erection to allow penetration. ?Loss of erection before sex is finished. ?What are the causes? ?This condition may be caused by: ?Physical causes, such as: ?Artery problems. This may include heart disease, high blood pressure, atherosclerosis, and diabetes. ?Hormonal problems, such as low testosterone. ?Obesity. ?Nerve problems. This may include back or pelvic injuries, multiple sclerosis, Parkinson's disease, spinal cord injury, and stroke. ?Certain medicines, such as: ?Pain relievers. ?Antidepressants. ?Blood pressure medicines and water pills (diuretics). ?Cancer medicines. ?Antihistamines. ?Muscle relaxants. ?Lifestyle factors, such as: ?Use of drugs such as marijuana, cocaine, or opioids. ?Excessive use of alcohol. ?Smoking. ?Lack of physical activity or exercise. ?Psychological causes, such as: ?Anxiety or stress. ?Sadness or depression. ?Exhaustion. ?Fear about sexual performance. ?Guilt. ?What are the signs or symptoms? ?Symptoms of this condition include: ?Inability to get an erection. ?Lack of enough hardness of the erection to allow penetration. ?Loss of the erection before sex is finished. ?Sometimes having normal erections, but with frequent unsatisfactory episodes. ?Low sexual satisfaction in either partner due to erection problems. ?A curved penis occurring with erection. The curve may cause pain, or the penis may be too curved to allow for intercourse. ?Never having nighttime or morning erections. ?How is this diagnosed? ?This condition is often diagnosed by: ?Performing a physical exam to find other diseases or specific problems with the penis. ?Asking you detailed questions about the problem. ?Doing tests,  such as: ?Blood tests to check for diabetes mellitus or high cholesterol, or to measure hormone levels. ?Other tests to check for underlying health conditions. ?An ultrasound exam to check for scarring. ?A test to check blood flow to the penis. ?Doing a sleep study at home to measure nighttime erections. ?How is this treated? ?This condition may be treated by: ?Medicines, such as: ?Medicine taken by mouth to help you achieve an erection (oral medicine). ?Hormone replacement therapy to replace low testosterone levels. ?Medicine that is injected into the penis. Your health care provider may instruct you how to give yourself these injections at home. ?Medicine that is delivered with a short applicator tube. The tube is inserted into the opening at the tip of the penis, which is the opening of the urethra. A tiny pellet of medicine is put in the urethra. The pellet dissolves and enhances erectile function. This is also called MUSE (medicated urethral system for erections) therapy. ?Vacuum pump. This is a pump with a ring on it. The pump and ring are placed on the penis and used to create pressure that helps the penis become erect. ?Penile implant surgery. In this procedure, you may receive: ?An inflatable implant. This consists of cylinders, a pump, and a reservoir. The cylinders can be inflated with a fluid that helps to create an erection, and they can be deflated after intercourse. ?A semi-rigid implant. This consists of two silicone rubber rods. The rods provide some rigidity. They are also flexible, so the penis can both curve downward in its normal position and become straight for sexual intercourse. ?Blood vessel surgery to improve blood flow to the penis. During this procedure, a blood vessel from a different part of the body is placed into the penis to allow blood to flow around (bypass) damaged or blocked blood vessels. ?Lifestyle changes,   such as exercising more, losing weight, and quitting smoking. ?Follow  these instructions at home: ?Medicines ? ?Take over-the-counter and prescription medicines only as told by your health care provider. Do not increase the dosage without first discussing it with your health care provider. ?If you are using self-injections, do injections as directed by your health care provider. Make sure you avoid any veins that are on the surface of the penis. After giving an injection, apply pressure to the injection site for 5 minutes. ?Talk to your health care provider about how to prevent headaches while taking ED medicines. These medicines may cause a sudden headache due to the increase in blood flow in your body. ?General instructions ?Exercise regularly, as directed by your health care provider. Work with your health care provider to lose weight, if needed. ?Do not use any products that contain nicotine or tobacco. These products include cigarettes, chewing tobacco, and vaping devices, such as e-cigarettes. If you need help quitting, ask your health care provider. ?Before using a vacuum pump, read the instructions that come with the pump and discuss any questions with your health care provider. ?Keep all follow-up visits. This is important. ?Contact a health care provider if: ?You feel nauseous. ?You are vomiting. ?You get sudden headaches while taking ED medicines. ?You have any concerns about your sexual health. ?Get help right away if: ?You are taking oral or injectable medicines and you have an erection that lasts longer than 4 hours. If your health care provider is unavailable, go to the nearest emergency room for evaluation. An erection that lasts much longer than 4 hours can result in permanent damage to your penis. ?You have severe pain in your groin or abdomen. ?You develop redness or severe swelling of your penis. ?You have redness spreading at your groin or lower abdomen. ?You are unable to urinate. ?You experience chest pain or a rapid heartbeat (palpitations) after taking oral  medicines. ?These symptoms may represent a serious problem that is an emergency. Do not wait to see if the symptoms will go away. Get medical help right away. Call your local emergency services (911 in the [place] Do not drive yourself to the hospital. ?Summary ?Erectile dysfunction (ED) is the inability to get or keep an erection during sexual intercourse. ?This condition is diagnosed based on a physical exam, your symptoms, and tests to determine the cause. Treatment varies depending on the cause and may include medicines, hormone therapy, surgery, or a vacuum pump. ?You may need follow-up visits to make sure that you are using your medicines or devices correctly. ?Get help right away if you are taking or injecting medicines and you have an erection that lasts longer than 4 hours. ?This information is not intended to replace advice given to you by your health care provider. Make sure you discuss any questions you have with your health care provider. ?Document Revised: 10/19/2020 Document Reviewed: 10/19/2020 ?Elsevier Patient Education ? 2023 [employer] ? ?

## 2022-01-15 ENCOUNTER — Other Ambulatory Visit: Payer: Self-pay | Admitting: Internal Medicine

## 2022-01-15 DIAGNOSIS — Z23 Encounter for immunization: Secondary | ICD-10-CM | POA: Insufficient documentation

## 2022-01-15 MED ORDER — SHINGRIX 50 MCG/0.5ML IM SUSR: 0.5000 mL | Freq: Once | INTRAMUSCULAR | 1 refills | Status: AC

## 2022-01-24 ENCOUNTER — Ambulatory Visit: Payer: Medicare Other | Admitting: Dermatology

## 2022-07-06 ENCOUNTER — Other Ambulatory Visit: Payer: Self-pay | Admitting: Internal Medicine

## 2022-07-06 ENCOUNTER — Telehealth: Payer: Self-pay | Admitting: Internal Medicine

## 2022-07-06 DIAGNOSIS — Z8619 Personal history of other infectious and parasitic diseases: Secondary | ICD-10-CM

## 2022-07-06 DIAGNOSIS — N5201 Erectile dysfunction due to arterial insufficiency: Secondary | ICD-10-CM

## 2022-07-06 MED ORDER — TADALAFIL 10 MG PO TABS: 10.0000 mg | ORAL_TABLET | Freq: Every day | ORAL | 2 refills | Status: DC | PRN

## 2022-07-06 MED ORDER — VALACYCLOVIR HCL 500 MG PO TABS: 500.0000 mg | ORAL_TABLET | Freq: Every day | ORAL | 0 refills | Status: DC

## 2022-07-06 NOTE — Telephone Encounter (Signed)
Caller & Relationship to patient: Micheal Doyle- self  Call back number: [phone]  Date of last office visit: 01-09-22  Date of next office visit: 08-07-22  Medication(s) to be refilled: tadalafil (CIALIS) 10 MG tablet   valACYclovir (VALTREX) 500 MG tablet   Preferred Pharmacy: [place], [place]

## 2022-07-10 ENCOUNTER — Ambulatory Visit (INDEPENDENT_AMBULATORY_CARE_PROVIDER_SITE_OTHER): Payer: Medicare Other

## 2022-07-10 VITALS — Ht 70.0 in

## 2022-07-10 DIAGNOSIS — Z0000 Encounter for general adult medical examination without abnormal findings: Secondary | ICD-10-CM | POA: Diagnosis not present

## 2022-07-10 NOTE — Progress Notes (Signed)
Virtual Visit via Telephone Note  I connected with  Micheal Doyle on 07/10/22 at  8:45 AM EST by telephone and verified that I am speaking with the correct person using two identifiers.  Location: Patient: Home Provider: [place] Persons participating in the virtual visit: [place]   I discussed the limitations, risks, security and privacy concerns of performing an evaluation and management service by telephone and the availability of in person appointments. The patient expressed understanding and agreed to proceed.  Interactive audio and video telecommunications were attempted between this nurse and patient, however failed, due to patient having technical difficulties OR patient did not have access to video capability.  We continued and completed visit with audio only.  Some vital signs may be absent or patient reported.   Sheral Flow, LPN  Subjective:   Micheal Doyle is a 66 y.o. male who presents for an Initial Medicare Annual Wellness Visit.  Review of Systems     Cardiac Risk Factors include: advanced age (>63mn, >>24women);male gender     Objective:    Today's Vitals   07/10/22 0847  Height: '5\' 10"'$  (1.778 m)  PainSc: 0-No pain   Body mass index is 26.4 kg/m.     07/10/2022    8:49 AM  Advanced Directives  Does Patient Have a Medical Advance Directive? Yes  Type of AParamedicof ABrainardsLiving will  Copy of [place]? No - copy requested    Current Medications (verified) Outpatient Encounter Medications as of 07/10/2022  Medication Sig   Rimegepant Sulfate (NURTEC) 75 MG TBDP Take 1 tablet by mouth every other day.   tadalafil (CIALIS) 10 MG tablet Take 1 tablet (10 mg total) by mouth daily as needed for erectile dysfunction.   thiamine (VITAMIN B-1) 50 MG tablet Take 1 tablet (50 mg total) by mouth every other day.   valACYclovir (VALTREX) 500 MG tablet Take 1 tablet (500  mg total) by mouth daily.   No facility-administered encounter medications on file as of 07/10/2022.    Allergies (verified) Crestor [rosuvastatin]   History: Past Medical History:  Diagnosis Date   Anemia    Cataract    bilateral - MD just watching    Chronic hepatitis C (HBath    treated - now neg per patient   Cirrhosis of liver (HC[place]    Cluster headache syndrome 1995   Former smoker    quit 02/2018   Herpes simplex eyelid dermatitis    History of basal cell cancer    on back/ over 10 years ago   Hyperlipidemia    Past Surgical History:  Procedure Laterality Date   COLONOSCOPY  2014   Dr KDicie BeamTOOTH EXTRACTION     Family History  Problem Relation Age of Onset   Cancer Neg Hx    Diabetes Neg Hx    Alcohol abuse Neg Hx    Depression Neg Hx    Drug abuse Neg Hx    Early death Neg Hx    Hearing loss Neg Hx    Heart disease Neg Hx    Hyperlipidemia Neg Hx    Hypertension Neg Hx    Kidney disease Neg Hx    Learning disabilities Neg Hx    Stroke Neg Hx    Stomach cancer Neg Hx    Rectal cancer Neg Hx    Colon cancer Neg Hx    Social History  Socioeconomic History   Marital status: Divorced    Spouse name: Not on file   Number of children: Not on file   Years of education: Not on file   Highest education level: Not on file  Occupational History   Not on file  Tobacco Use   Smoking status: Former    Packs/day: 0.50    Years: 40.00    Total pack years: 20.00    Types: Cigarettes    Quit date: 02/2018    Years since quitting: 4.4   Smokeless tobacco: Never  Vaping Use   Vaping Use: Never used  Substance and Sexual Activity   Alcohol use: No   Drug use: Yes    Types: Marijuana    Comment: Last use - "years ago" per patient   Sexual activity: Yes    Birth control/protection: None  Other Topics Concern   Not on file  Social History Narrative   Not on file   Social Determinants of Health   Financial Resource Strain: Low Risk   (07/10/2022)   Overall Financial Resource Strain (CARDIA)    Difficulty of Paying Living Expenses: Not hard at all  Food Insecurity: No Food Insecurity (07/10/2022)   Hunger Vital Sign    Worried About Running Out of Food in the Last Year: Never true    Ran Out of Food in the Last Year: Never true  Transportation Needs: No Transportation Needs (07/10/2022)   PRAPARE - Hydrologist (Medical): No    Lack of Transportation (Non-Medical): No  Physical Activity: Sufficiently Active (07/10/2022)   Exercise Vital Sign    Days of Exercise per Week: 7 days    Minutes of Exercise per Session: 30 min  Stress: No Stress Concern Present (07/10/2022)   [place]    Feeling of Stress : Not at all  Social Connections: Moderately Integrated (07/10/2022)   Social Connection and Isolation Panel [NHANES]    Frequency of Communication with Friends and Family: More than three times a week    Frequency of Social Gatherings with Friends and Family: More than three times a week    Attends Religious Services: More than 4 times per year    Active Member of [employer] or Organizations: Yes    Attends Music therapist: More than 4 times per year    Marital Status: Divorced    Tobacco Counseling Counseling given: Not Answered   Clinical Intake:  Pre-visit preparation completed: Yes  Pain : No/denies pain Pain Score: 0-No pain     BMI - recorded: 26.4 (01/09/2022) Nutritional Status: BMI 25 -29 Overweight Nutritional Risks: None Diabetes: No  How often do you need to have someone help you when you read instructions, pamphlets, or other written materials from your doctor or pharmacy?: 1 - Never What is the last grade level you completed in school?: HSG; 2 years of college  Diabetic? No  Interpreter Needed?: No  Information entered by :: Lisette Abu, LPN.   Activities of Daily Living     07/10/2022    8:58 AM 07/17/2021    3:13 PM  In your present state of health, do you have any difficulty performing the following activities:  Hearing? 0 0  Vision? 0 0  Difficulty concentrating or making decisions? 0 0  Walking or climbing stairs? 0 0  Dressing or bathing? 0 0  Doing errands, shopping? 0 0  Preparing Food and eating ? N  Using the Toilet? N   In the past six months, have you accidently leaked urine? N   Do you have problems with loss of bowel control? N   Managing your Medications? N   Managing your Finances? N   Housekeeping or managing your Housekeeping? N     Patient Care Team: Janith Lima, MD as PCP - General (Internal Medicine) [facility] as Consulting Physician (Ophthalmology)  Indicate any recent Medical Services you may have received from other than [facility] providers in the past year (date may be approximate).     Assessment:   This is a routine wellness examination for Tan.  Hearing/Vision screen Hearing Screening - Comments:: Denies hearing difficulties   Vision Screening - Comments:: Wears rx glasses - up to date with routine eye exams with [facility]   Dietary issues and exercise activities discussed: Current Exercise Habits: Home exercise routine;Structured exercise class, Type of exercise: walking;treadmill;stretching;strength training/weights;exercise ball, Time (Minutes): 30, Frequency (Times/Week): 7, Weekly Exercise (Minutes/Week): 210, Intensity: Moderate, Exercise limited by: None identified   Goals Addressed             This Visit's Progress    To maintain my current health status by continuing to eat healthy, stay physically active and socially active.        Depression Screen    07/10/2022    8:52 AM 07/17/2021    3:20 PM 02/11/2020    4:09 PM 02/26/2018    1:11 PM 06/05/2016   10:40 AM 06/17/2013    3:21 PM  PHQ 2/9 Scores  PHQ - 2 Score 0 0 0 0 0 0    Fall Risk    07/10/2022    8:50 AM  07/17/2021    3:20 PM 02/11/2020    4:08 PM 06/05/2016   10:40 AM 06/17/2013    3:21 PM  [place] in the past year? 0 0 0 No No  Number falls in past yr: 0  0    Injury with Fall? 0  0    Risk for fall due to : No Fall Risks  No Fall Risks    Follow up Falls prevention discussed  Falls evaluation completed      FALL RISK PREVENTION PERTAINING TO THE HOME:  Any stairs in or around the home? No  If so, are there any without handrails? No  Home free of loose throw rugs in walkways, pet beds, electrical cords, etc? Yes  Adequate lighting in your home to reduce risk of falls? Yes   ASSISTIVE DEVICES UTILIZED TO PREVENT FALLS:  Life alert? No  Use of a cane, walker or w/c? No  Grab bars in the bathroom? No  Shower chair or bench in shower? No  Elevated toilet seat or a handicapped toilet? No   TIMED UP AND GO:  Was the test performed? No . Phone Visit   Cognitive Function:        07/10/2022    8:59 AM  6CIT Screen  What Year? 0 points  What month? 0 points  What time? 0 points  Count back from 20 0 points  Months in reverse 0 points  Repeat phrase 0 points  Total Score 0 points    Immunizations Immunization History  Administered Date(s) Administered   Fluad Quad(high Dose 65+) 07/17/2021, 06/11/2022   Hepatitis B, adult 06/05/2016, 07/09/2016, 12/05/2016   Influenza,inj,Quad PF,6+ Mos 06/17/2013, 07/08/2014, 04/02/2016   PFIZER Comirnaty(Gray Top)Covid-19 Tri-Sucrose Vaccine 06/11/2022  PFIZER(Purple Top)SARS-COV-2 Vaccination 04/26/2021   PNEUMOCOCCAL CONJUGATE-20 07/17/2021   Pneumococcal Polysaccharide-23 06/17/2013   Tdap 02/27/2013   Zoster, Live 07/08/2014    TDAP status: Up to date  Flu Vaccine status: Up to date  Pneumococcal vaccine status: Up to date  Covid-19 vaccine status: Completed vaccines  Qualifies for Shingles Vaccine? Yes   Zostavax completed Yes   Shingrix Completed?: No.    Education has been provided regarding the  importance of this vaccine. Patient has been advised to call insurance company to determine out of pocket expense if they have not yet received this vaccine. Advised may also receive vaccine at local pharmacy or Health Dept. Verbalized acceptance and understanding.  Screening Tests Health Maintenance  Topic Date Due   Zoster Vaccines- Shingrix (1 of 2) Never done   Lung Cancer Screening  10/05/2021   COVID-19 Vaccine (3 - Pfizer risk series) 07/09/2022   DTaP/Tdap/Td (2 - Td or Tdap) 02/28/2023   COLONOSCOPY (Pts 45-15yr Insurance coverage will need to be confirmed)  05/31/2023   Medicare Annual Wellness (AWV)  07/11/2023   Pneumonia Vaccine 66 Years old  Completed   INFLUENZA VACCINE  Completed   Hepatitis C Screening  Completed   HPV VACCINES  Aged Out    Health Maintenance  Health Maintenance Due  Topic Date Due   Zoster Vaccines- Shingrix (1 of 2) Never done   Lung Cancer Screening  10/05/2021   COVID-19 Vaccine (3 - Pfizer risk series) 07/09/2022    Colorectal cancer screening: Type of screening: Colonoscopy. Completed 05/30/2020. Repeat every 3 years  Lung Cancer Screening: (Low Dose CT Chest recommended if Age 66-80years, 30 pack-year currently smoking OR have quit w/in 15years.) does qualify.   Lung Cancer Screening Referral: no  Additional Screening:  Hepatitis C Screening: does qualify; Completed 09/08/2021  Vision Screening: Recommended annual ophthalmology exams for early detection of glaucoma and other disorders of the eye. Is the patient up to date with their annual eye exam?  Yes  Who is the provider or what is the name of the office [facility] which the patient [facility]ends [facility] If pt is not established with a provider, would they like to be referred to a provider to establish care? No .   Dental Screening: Recommended annual dental exams for proper oral hygiene  Community Resource Referral / Chronic Care Management: CRR required this visit?   No   CCM required this visit?  No      Plan:     I have personally reviewed and noted the following in the patient's chart:   Medical and social history Use of alcohol, tobacco or illicit drugs  Current medications and supplements including opioid prescriptions. Patient is not currently taking opioid prescriptions. Functional ability and status Nutritional status Physical activity Advanced directives List of other physicians Hospitalizations, surgeries, and ER visits in previous 12 months Vitals Screenings to include cognitive, depression, and falls Referrals and appointments  In addition, I have reviewed and discussed with patient certain preventive protocols, quality metrics, and best practice recommendations. A written personalized care plan for preventive services as well as general preventive health recommendations were provided to patient.     SSheral Flow LPN   157/10/2200  Nurse Notes: N/A

## 2022-07-10 NOTE — Patient Instructions (Signed)
Mr. Micheal Doyle , Thank you for taking time to come for your Medicare Wellness Visit. I appreciate your ongoing commitment to your health goals. Please review the following plan we discussed and let me know if I can assist you in the future.   These are the goals we discussed:  Goals      To maintain my current health status by continuing to eat healthy, stay physically active and socially active.        This is a list of the screening recommended for you and due dates:  Health Maintenance  Topic Date Due   Zoster (Shingles) Vaccine (1 of 2) Never done   Screening for Lung Cancer  10/05/2021   COVID-19 Vaccine (3 - Pfizer risk series) 07/09/2022   DTaP/Tdap/Td vaccine (2 - Td or Tdap) 02/28/2023   Colon Cancer Screening  05/31/2023   Medicare Annual Wellness Visit  07/11/2023   Pneumonia Vaccine  Completed   Flu Shot  Completed   Hepatitis C Screening: USPSTF Recommendation to screen - Ages 18-79 yo.  Completed   HPV Vaccine  Aged Out    Advanced directives: Yes; Please bring a copy of your health care power of attorney and living will to the office at your convenience.  Conditions/risks identified: Yes  Next appointment: Follow up in one year for your annual wellness visit.   Preventive Care 110 Years and Older, Male  Preventive care refers to lifestyle choices and visits with your health care provider that can promote health and wellness. What does preventive care include? A yearly physical exam. This is also called an annual well check. Dental exams once or twice a year. Routine eye exams. Ask your health care provider how often you should have your eyes checked. Personal lifestyle choices, including: Daily care of your teeth and gums. Regular physical activity. Eating a healthy diet. Avoiding tobacco and drug use. Limiting alcohol use. Practicing safe sex. Taking low doses of aspirin every day. Taking vitamin and mineral supplements as recommended by your health care  provider. What happens during an annual well check? The services and screenings done by your health care provider during your annual well check will depend on your age, overall health, lifestyle risk factors, and family history of disease. Counseling  Your health care provider may ask you questions about your: Alcohol use. Tobacco use. Drug use. Emotional well-being. Home and relationship well-being. Sexual activity. Eating habits. History of falls. Memory and ability to understand (cognition). Work and work Statistician. Screening  You may have the following tests or measurements: Height, weight, and BMI. Blood pressure. Lipid and cholesterol levels. These may be checked every 5 years, or more frequently if you are over 66 years old. Skin check. Lung cancer screening. You may have this screening every year starting at age 18 if you have a 30-pack-year history of smoking and currently smoke or have quit within the past 15 years. Fecal occult blood test (FOBT) of the stool. You may have this test every year starting at age 66. Flexible sigmoidoscopy or colonoscopy. You may have a sigmoidoscopy every 5 years or a colonoscopy every 10 years starting at age 66. Prostate cancer screening. Recommendations will vary depending on your family history and other risks. Hepatitis C blood test. Hepatitis B blood test. Sexually transmitted disease (STD) testing. Diabetes screening. This is done by checking your blood sugar (glucose) after you have not eaten for a while (fasting). You may have this done every 1-3 years. Abdominal aortic aneurysm (AAA)  screening. You may need this if you are a current or former smoker. Osteoporosis. You may be screened starting at age 66 if you are at high risk. Talk with your health care provider about your test results, treatment options, and if necessary, the need for more tests. Vaccines  Your health care provider may recommend certain vaccines, such  as: Influenza vaccine. This is recommended every year. Tetanus, diphtheria, and acellular pertussis (Tdap, Td) vaccine. You may need a Td booster every 10 years. Zoster vaccine. You may need this after age 66. Pneumococcal 13-valent conjugate (PCV13) vaccine. One dose is recommended after age 66. Pneumococcal polysaccharide (PPSV23) vaccine. One dose is recommended after age 66. Talk to your health care provider about which screenings and vaccines you need and how often you need them. This information is not intended to replace advice given to you by your health care provider. Make sure you discuss any questions you have with your health care provider. Document Released: 08/19/2015 Document Revised: 04/11/2016 Document Reviewed: 05/24/2015 Elsevier Interactive Patient Education  2017 [place] Prevention in the Home Falls can cause injuries. They can happen to people of all ages. There are many things you can do to make your home safe and to help prevent falls. What can I do on the outside of my home? Regularly fix the edges of walkways and driveways and fix any cracks. Remove anything that might make you trip as you walk through a door, such as a raised step or threshold. Trim any bushes or trees on the path to your home. Use bright outdoor lighting. Clear any walking paths of anything that might make someone trip, such as rocks or tools. Regularly check to see if handrails are loose or broken. Make sure that both sides of any steps have handrails. Any raised decks and porches should have guardrails on the edges. Have any leaves, snow, or ice cleared regularly. Use sand or salt on walking paths during winter. Clean up any spills in your garage right away. This includes oil or grease spills. What can I do in the bathroom? Use night lights. Install grab bars by the toilet and in the tub and shower. Do not use towel bars as grab bars. Use non-skid mats or decals in the tub or  shower. If you need to sit down in the shower, use a plastic, non-slip stool. Keep the floor dry. Clean up any water that spills on the floor as soon as it happens. Remove soap buildup in the tub or shower regularly. Attach bath mats securely with double-sided non-slip rug tape. Do not have throw rugs and other things on the floor that can make you trip. What can I do in the bedroom? Use night lights. Make sure that you have a light by your bed that is easy to reach. Do not use any sheets or blankets that are too big for your bed. They should not hang down onto the floor. Have a firm chair that has side arms. You can use this for support while you get dressed. Do not have throw rugs and other things on the floor that can make you trip. What can I do in the kitchen? Clean up any spills right away. Avoid walking on wet floors. Keep items that you use a lot in easy-to-reach places. If you need to reach something above you, use a strong step stool that has a grab bar. Keep electrical cords out of the way. Do not use floor polish or  wax that makes floors slippery. If you must use wax, use non-skid floor wax. Do not have throw rugs and other things on the floor that can make you trip. What can I do with my stairs? Do not leave any items on the stairs. Make sure that there are handrails on both sides of the stairs and use them. Fix handrails that are broken or loose. Make sure that handrails are as long as the stairways. Check any carpeting to make sure that it is firmly attached to the stairs. Fix any carpet that is loose or worn. Avoid having throw rugs at the top or bottom of the stairs. If you do have throw rugs, attach them to the floor with carpet tape. Make sure that you have a light switch at the top of the stairs and the bottom of the stairs. If you do not have them, ask someone to add them for you. What else can I do to help prevent falls? Wear shoes that: Do not have high heels. Have  rubber bottoms. Are comfortable and fit you well. Are closed at the toe. Do not wear sandals. If you use a stepladder: Make sure that it is fully opened. Do not climb a closed stepladder. Make sure that both sides of the stepladder are locked into place. Ask someone to hold it for you, if possible. Clearly mark and make sure that you can see: Any grab bars or handrails. First and last steps. Where the edge of each step is. Use tools that help you move around (mobility aids) if they are needed. These include: Canes. Walkers. Scooters. Crutches. Turn on the lights when you go into a dark area. Replace any light bulbs as soon as they burn out. Set up your furniture so you have a clear path. Avoid moving your furniture around. If any of your floors are uneven, fix them. If there are any pets around you, be aware of where they are. Review your medicines with your doctor. Some medicines can make you feel dizzy. This can increase your chance of falling. Ask your doctor what other things that you can do to help prevent falls. This information is not intended to replace advice given to you by your health care provider. Make sure you discuss any questions you have with your health care provider. Document Released: 05/19/2009 Document Revised: 12/29/2015 Document Reviewed: 08/27/2014 Elsevier Interactive Patient Education  2017 [employer].

## 2022-08-07 ENCOUNTER — Ambulatory Visit: Payer: Medicare Other | Admitting: Internal Medicine

## 2022-08-21 IMAGING — CT CT CHEST W/O CM
1 series · 15 of 34 positions shown, 19 images · non-contrast
Comparison: 03/15/2020

CLINICAL DATA: Follow-up pulmonary nodule.

EXAM:
CT CHEST WITHOUT CONTRAST
TECHNIQUE: Multidetector CT imaging of the chest was performed following the
standard protocol without IV contrast.

[Series 2: chest w/(date) · axial · 0.79mm/px · z∈[-336,-44]mm · 15 of 172 slices shown, 19 images]
[im 13/172  mediastinal]
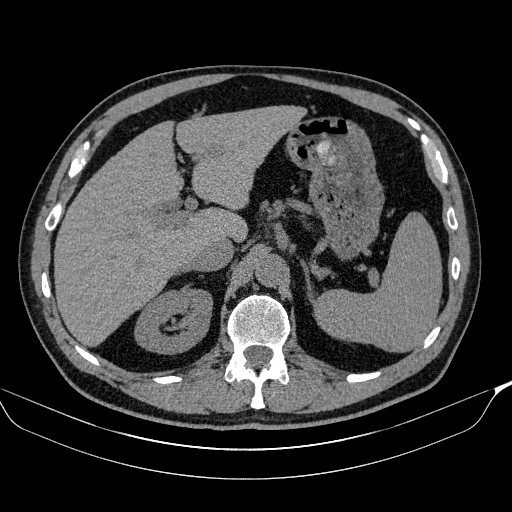
[im 13/172  lung]
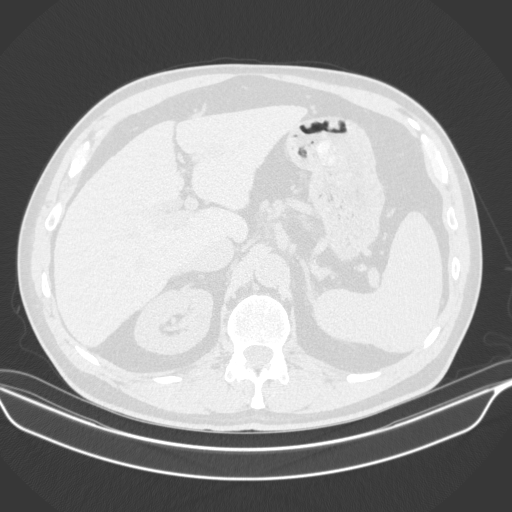
[im 26/172  lung]
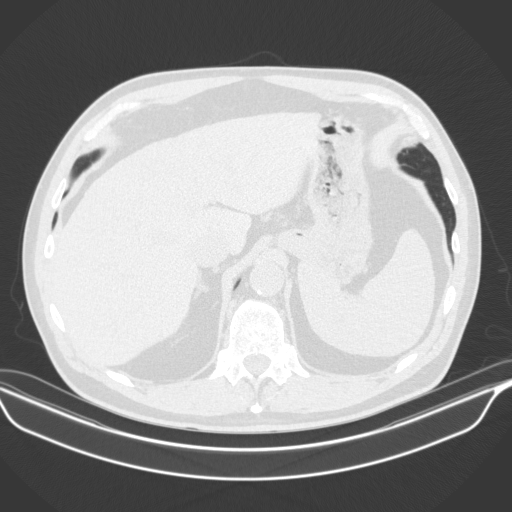
[im 35/172  lung]
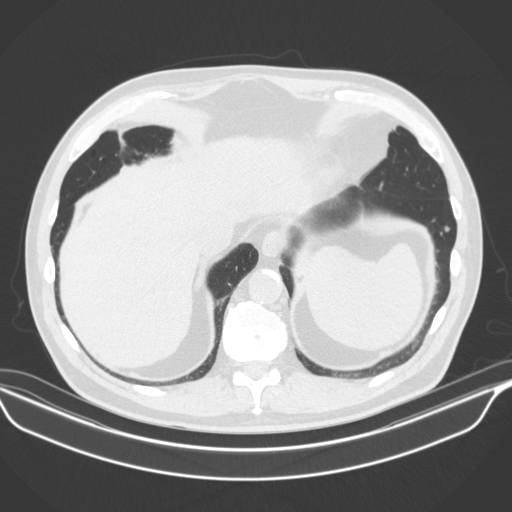
[im 45/172  lung]
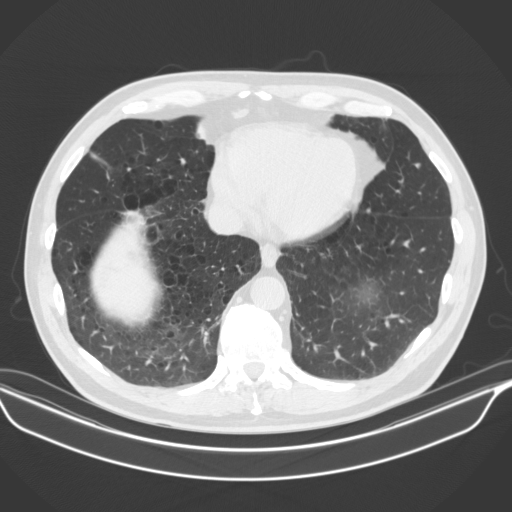
[im 58/172  mediastinal]
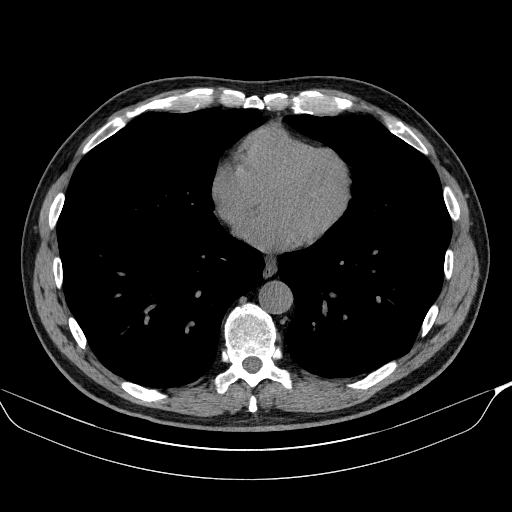
[im 58/172  lung]
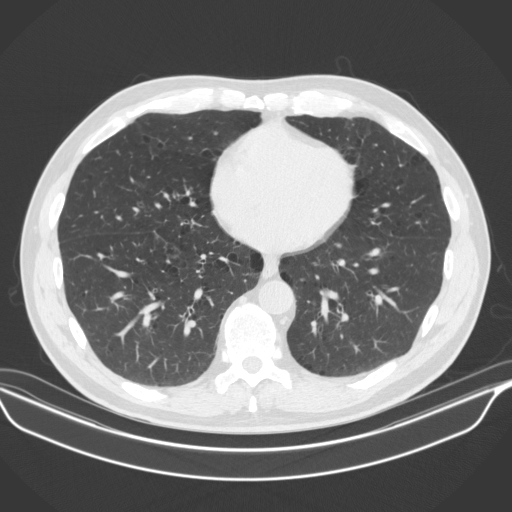
[im 69/172  lung]
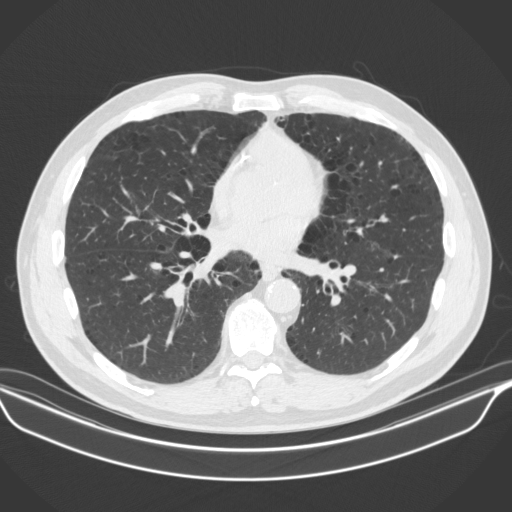
[im 77/172  lung]
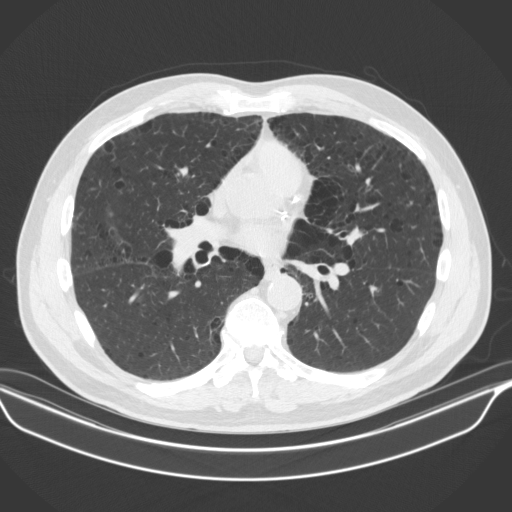
[im 89/172  lung]
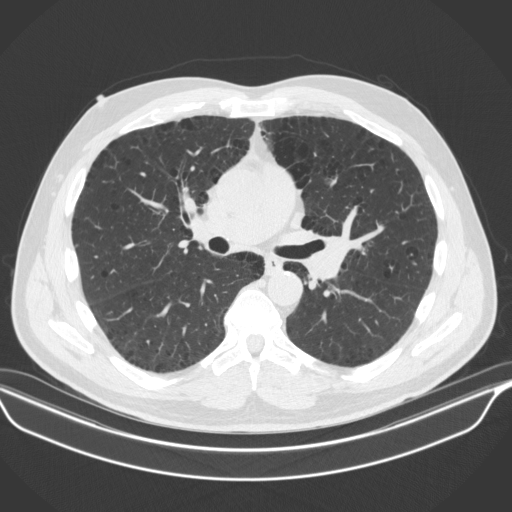
[im 96/172  mediastinal]
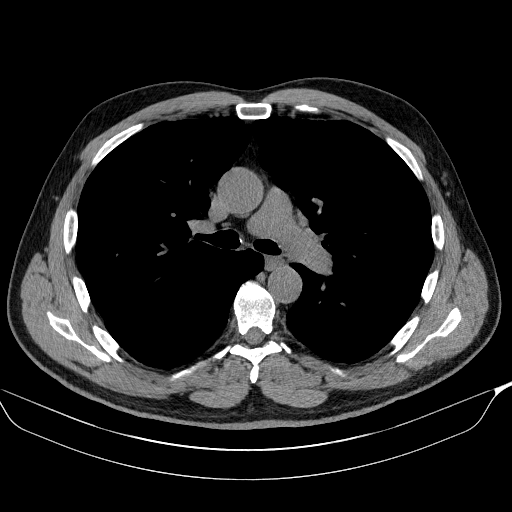
[im 96/172  lung]
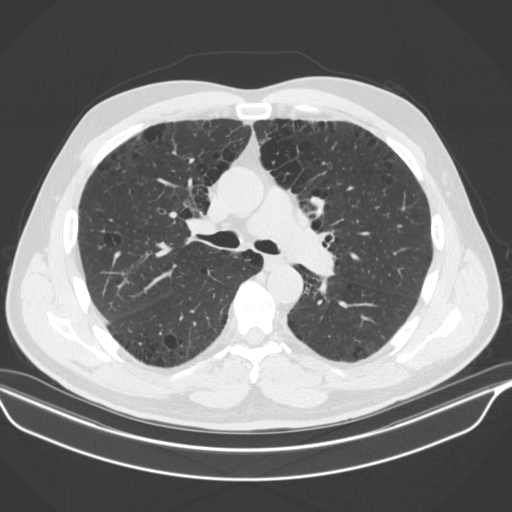
[im 103/172  lung]
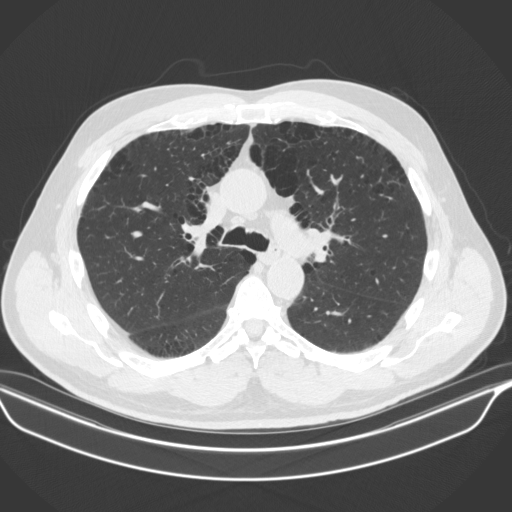
[im 115/172  lung]
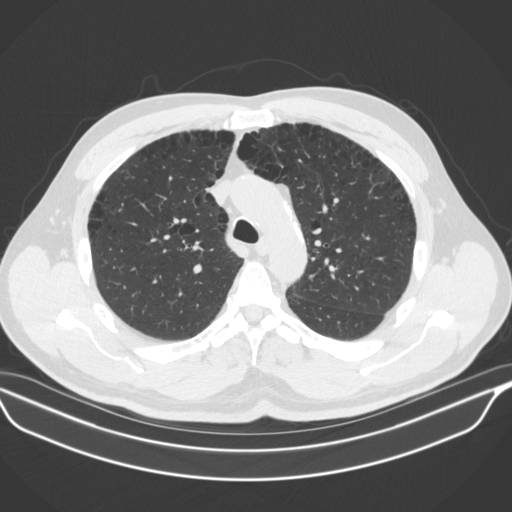
[im 127/172  lung]
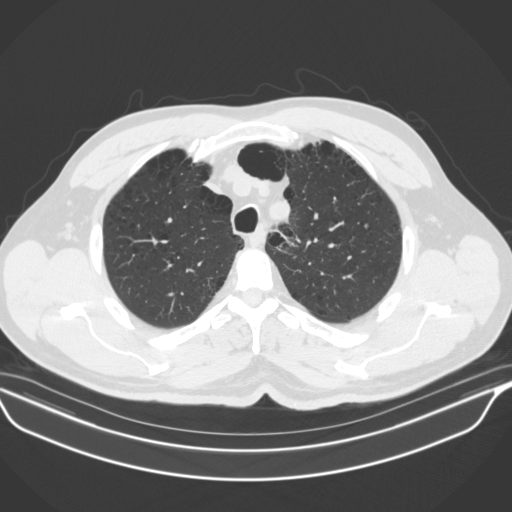
[im 137/172  mediastinal]
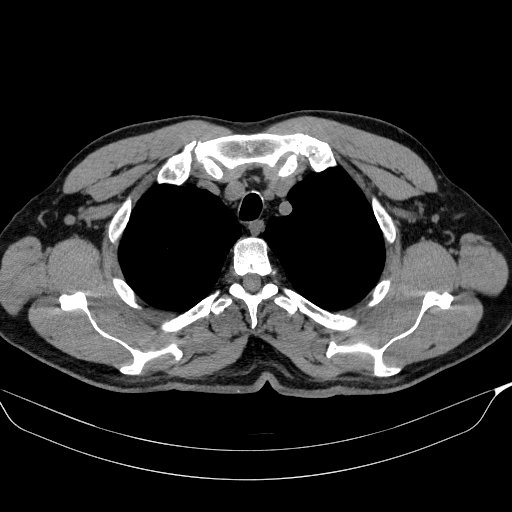
[im 137/172  lung]
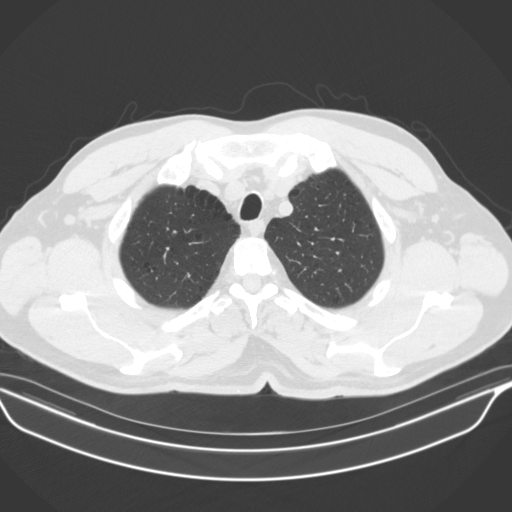
[im 146/172  lung]
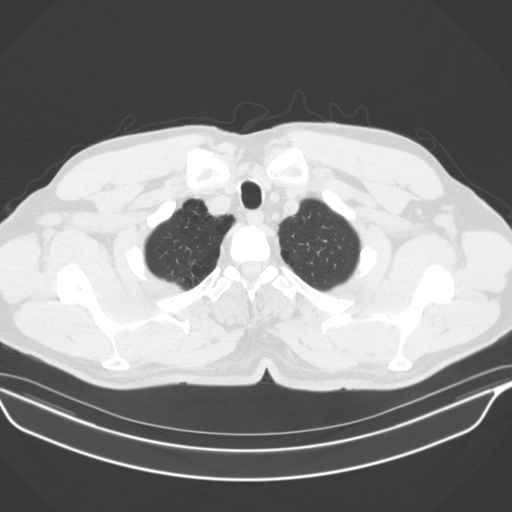
[im 159/172  lung]
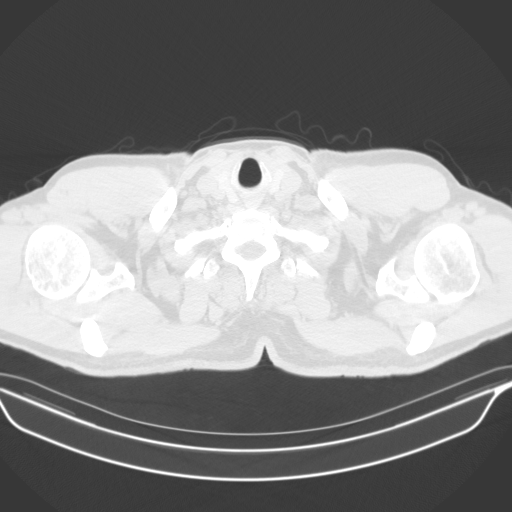

[15 of 34 positions shown; findings below may reference images not displayed]

FINDINGS: Cardiovascular: The heart size is normal. Aortic atherosclerosis.
Coronary artery atherosclerotic calcification. No pericardial
effusion.

Mediastinum/Nodes: No enlarged mediastinal or axillary lymph nodes.
Thyroid gland, trachea, and esophagus demonstrate no significant
findings.

Lungs/Pleura: Paraseptal and centrilobular emphysema. Left lower
lobe lung nodule measures 4 mm, image 137/5. This is unchanged
compared with previous exam. Stable 4 mm lingular nodule, image
129/5. 4 mm perifissural nodule in the left midlung is stable, image
105/5. No new or enlarging nodules.

Upper Abdomen: Similar appearance of mildly nodular liver contour
compatible with cirrhosis. No acute findings within the imaged
portions of the upper abdomen.

Musculoskeletal: No chest wall mass or suspicious bone lesions
identified. Degenerative changes are noted within the lower thoracic
spine. Stable mild superior endplate compression deformity involving
the T11 vertebra.
IMPRESSION: 1. Stable small pulmonary nodules within the left lung measuring up
to 4 mm. No follow-up needed if patient is low-risk (and has no
known or suspected primary neoplasm). Non-contrast chest CT can be
considered at 12 months (from 03/15/2020) if patient is high-risk.
This recommendation follows the consensus statement: Guidelines for
Management of Incidental Pulmonary Nodules Detected on CT Images:
2. Coronary artery calcifications noted.
3. Morphologic features of the liver compatible with cirrhosis.

Aortic Atherosclerosis ([id number].L) and Emphysema ([id number].5).

## 2023-02-19 ENCOUNTER — Ambulatory Visit (INDEPENDENT_AMBULATORY_CARE_PROVIDER_SITE_OTHER): Payer: Medicare Other | Admitting: Internal Medicine

## 2023-02-19 ENCOUNTER — Encounter: Payer: Self-pay | Admitting: Internal Medicine

## 2023-02-19 VITALS — BP 126/74 | HR 75 | Temp 97.9°F | Ht 70.0 in | Wt 187.0 lb

## 2023-02-19 DIAGNOSIS — E519 Thiamine deficiency, unspecified: Secondary | ICD-10-CM | POA: Diagnosis not present

## 2023-02-19 DIAGNOSIS — D229 Melanocytic nevi, unspecified: Secondary | ICD-10-CM

## 2023-02-19 DIAGNOSIS — B182 Chronic viral hepatitis C: Secondary | ICD-10-CM

## 2023-02-19 DIAGNOSIS — N401 Enlarged prostate with lower urinary tract symptoms: Secondary | ICD-10-CM

## 2023-02-19 DIAGNOSIS — E785 Hyperlipidemia, unspecified: Secondary | ICD-10-CM | POA: Diagnosis not present

## 2023-02-19 DIAGNOSIS — R351 Nocturia: Secondary | ICD-10-CM | POA: Diagnosis not present

## 2023-02-19 DIAGNOSIS — K769 Liver disease, unspecified: Secondary | ICD-10-CM

## 2023-02-19 LAB — PROTIME-INR
INR: 1.2 ratio — ABNORMAL HIGH (ref 0.8–1.0)
Prothrombin Time: 12.2 s (ref 9.6–13.1)

## 2023-02-19 LAB — URINALYSIS, ROUTINE W REFLEX MICROSCOPIC
Bilirubin Urine: NEGATIVE
Hgb urine dipstick: NEGATIVE
Ketones, ur: NEGATIVE
Leukocytes,Ua: NEGATIVE
Nitrite: NEGATIVE
Specific Gravity, Urine: 1.025 (ref 1.000–1.030)
Total Protein, Urine: NEGATIVE
Urine Glucose: 100 — AB
Urobilinogen, UA: 0.2 (ref 0.0–1.0)
pH: 6 (ref 5.0–8.0)

## 2023-02-19 LAB — LIPID PANEL
Cholesterol: 160 mg/dL (ref 0–200)
HDL: 37.1 mg/dL — ABNORMAL LOW (ref >39.00)
LDL Cholesterol: 91 mg/dL (ref 0–99)
NonHDL: 123.39
Total CHOL/HDL Ratio: 4
Triglycerides: 160 mg/dL — ABNORMAL HIGH (ref 0.0–149.0)
VLDL: 32 mg/dL (ref 0.0–40.0)

## 2023-02-19 LAB — BASIC METABOLIC PANEL
BUN: 14 mg/dL (ref 6–23)
CO2: 27 mEq/L (ref 19–32)
Calcium: 9.8 mg/dL (ref 8.4–10.5)
Chloride: 103 mEq/L (ref 96–112)
Creatinine, Ser: 0.77 mg/dL (ref 0.40–1.50)
GFR: 92.96 mL/min (ref >60.00)
Glucose, Bld: 92 mg/dL (ref 70–99)
Potassium: 4.5 mEq/L (ref 3.5–5.1)
Sodium: 136 mEq/L (ref 135–145)

## 2023-02-19 LAB — CBC WITH DIFFERENTIAL/PLATELET
Basophils Absolute: 0 10*3/uL (ref 0.0–0.1)
Basophils Relative: 0.8 % (ref 0.0–3.0)
Eosinophils Absolute: 0.1 10*3/uL (ref 0.0–0.7)
Eosinophils Relative: 2.3 % (ref 0.0–5.0)
HCT: 41.1 % (ref 39.0–52.0)
Hemoglobin: 14.1 g/dL (ref 13.0–17.0)
Lymphocytes Relative: 38.3 % (ref 12.0–46.0)
Lymphs Abs: 2.1 10*3/uL (ref 0.7–4.0)
MCHC: 34.3 g/dL (ref 30.0–36.0)
MCV: 104.1 fl — ABNORMAL HIGH (ref 78.0–100.0)
Monocytes Absolute: 0.3 10*3/uL (ref 0.1–1.0)
Monocytes Relative: 6 % (ref 3.0–12.0)
Neutro Abs: 2.8 10*3/uL (ref 1.4–7.7)
Neutrophils Relative %: 52.6 % (ref 43.0–77.0)
Platelets: 362 10*3/uL (ref 150.0–400.0)
RBC: 3.95 Mil/uL — ABNORMAL LOW (ref 4.22–5.81)
RDW: 14.3 % (ref 11.5–15.5)
WBC: 5.4 10*3/uL (ref 4.0–10.5)

## 2023-02-19 LAB — HEPATIC FUNCTION PANEL
ALT: 21 U/L (ref 0–53)
AST: 14 U/L (ref 0–37)
Albumin: 4.8 g/dL (ref 3.5–5.2)
Alkaline Phosphatase: 47 U/L (ref 39–117)
Bilirubin, Direct: 0.1 mg/dL (ref 0.0–0.3)
Total Bilirubin: 0.7 mg/dL (ref 0.2–1.2)
Total Protein: 7.5 g/dL (ref 6.0–8.3)

## 2023-02-19 LAB — PSA: PSA: 0.47 ng/mL (ref 0.10–4.00)

## 2023-02-19 LAB — TSH: TSH: 2.94 u[IU]/mL (ref 0.35–5.50)

## 2023-02-19 MED ORDER — PRAVASTATIN SODIUM 10 MG PO TABS: 10.0000 mg | ORAL_TABLET | Freq: Every day | ORAL | 1 refills | Status: DC

## 2023-02-19 MED ORDER — TADALAFIL 5 MG PO TABS
5.0000 mg | ORAL_TABLET | Freq: Every day | ORAL | 1 refills | Status: DC
Start: 2023-02-19 — End: 2024-02-25

## 2023-02-19 NOTE — Progress Notes (Unsigned)
Subjective:  Patient ID: Micheal Doyle, male    DOB: 06-Jul-1956  Age: 67 y.o. MRN: [id number]  CC: Hyperlipidemia   HPI Micheal Doyle presents for f/up ---   Discussed the use of AI scribe software for clinical note transcription with the patient, who gave verbal consent to proceed.  History of Present Illness   The patient, who is still actively working and engaging in regular physical activity, reports no chest pain or shortness of breath during these activities. They walk at least a mile daily and frequently use stairs, with no associated cardiac symptoms. They deny any recent changes in weight or appetite.  Over the past year and a half, the patient has noticed a gradual decrease in their nocturnal urinary output. They report waking up multiple times at night to urinate, but the volume is less than during the day. They deny any straining, hematuria, or abnormal cells in the urine. This change was noticed after a previous examination revealed a swollen prostate.       Outpatient Medications Prior to Visit  Medication Sig Dispense Refill   Rimegepant Sulfate (NURTEC) 75 MG TBDP Take 1 tablet by mouth every other day. 16 tablet 5   thiamine (VITAMIN B-1) 50 MG tablet Take 1 tablet (50 mg total) by mouth every other day. 45 tablet 1   valACYclovir (VALTREX) 500 MG tablet Take 1 tablet (500 mg total) by mouth daily. 90 tablet 0   tadalafil (CIALIS) 10 MG tablet Take 1 tablet (10 mg total) by mouth daily as needed for erectile dysfunction. 10 tablet 2   No facility-administered medications prior to visit.    ROS Review of Systems  Objective:  BP 126/74 (BP Location: Left Arm, Patient Position: Sitting, Cuff Size: Large)   Pulse 75   Temp 97.9 F (36.6 C) (Oral)   Ht 5\' 10"  (1.778 m)   Wt 187 lb (84.8 kg)   SpO2 97%   BMI 26.83 kg/m   BP Readings from Last 3 Encounters:  02/19/23 126/74  01/09/22 124/76  07/17/21 134/76    Wt Readings from Last 3 Encounters:   02/19/23 187 lb (84.8 kg)  01/09/22 184 lb (83.5 kg)  07/17/21 188 lb (85.3 kg)    Physical Exam  Lab Results  Component Value Date   WBC 5.4 02/19/2023   HGB 14.1 02/19/2023   HCT 41.1 02/19/2023   PLT 362.0 02/19/2023   GLUCOSE 92 02/19/2023   CHOL 160 02/19/2023   TRIG 160.0 (H) 02/19/2023   HDL 37.10 (L) 02/19/2023   LDLDIRECT 96.0 07/17/2021   LDLCALC 91 02/19/2023   ALT 21 02/19/2023   AST 14 02/19/2023   NA 136 02/19/2023   K 4.5 02/19/2023   CL 103 02/19/2023   CREATININE 0.77 02/19/2023   BUN 14 02/19/2023   CO2 27 02/19/2023   TSH 2.94 02/19/2023   PSA 0.47 02/19/2023   INR 1.2 (H) 02/19/2023   HGBA1C 5.8 (H) 02/12/2020    MR Abdomen W Wo Contrast  Result Date: 09/10/2021 CLINICAL DATA:  Follow-up liver lesion. EXAM: MRI ABDOMEN WITHOUT AND WITH CONTRAST TECHNIQUE: Multiplanar multisequence MR imaging of the abdomen was performed both before and after the administration of intravenous contrast. CONTRAST:  18mL MULTIHANCE GADOBENATE DIMEGLUMINE 529 MG/ML IV SOLN COMPARISON:  08/31/2020 FINDINGS: Lower chest: No acute findings. Hepatobiliary: Cyst within segment 3 of left lobe of liver containing a few thin internal areas of septation measures 2.2 by 1.5 cm.  This is unchanged compared with 04/14/20 compatible with a benign liver cyst. No significant internal enhancement associated with this structure. No new liver lesions. The gallbladder appears normal. No bile duct dilatation. Pancreas: No mass, inflammatory changes, or other parenchymal abnormality identified. Spleen: Multiple small T2 hyperintense foci are again noted scattered throughout both lobes of liver. The largest of these measures 1.1 cm, image 17/7. Allowing for differences in technique these do not appear significantly changed in size or multiplicity. On the early postcontrast T1 weighted images these lesions are predominantly hypointense. On the delayed images there is contrast fill-in within these lesions  compatible with multiple benign hemangiomas. Adrenals/Urinary Tract: Normal adrenal glands. Bilateral upper pole kidney cysts are stable from the previous exam. No suspicious enhancing kidney lesions identified. Stomach/Bowel: Visualized portions within the abdomen are unremarkable. Vascular/Lymphatic: Aortic atherosclerosis. No aneurysm. Prominent upper abdominal lymph nodes are unchanged when compared with the previous exam. For example, within the porta hepatic region there is a stable lymph node measuring 1.1 cm, image 16/7. Adjacent lymph node measures 1.3 cm, image 15/7 and is also unchanged. No enlarged retroperitoneal or mesenteric lymph nodes. Other:  No free fluid or fluid collections. Musculoskeletal: No suspicious bone lesions identified. IMPRESSION: 1. No acute findings within the abdomen. 2. Stable appearance of multiple small T2 hyperintense foci scattered throughout both lobes of liver compatible with enhancement characteristics consistent with multiple benign hemangiomas. 3. Unchanged appearance of septated cyst within segment 3 of the liver. 4. Stable appearance of bilateral upper pole kidney cysts. Electronically Signed   By: Signa Kell M.D.   On: 09/10/2021 16:16    Assessment & Plan:  BPH associated with nocturia -     PSA; Future -     Urinalysis, Routine w reflex microscopic; Future -     Basic metabolic panel; Future -     Tadalafil; Take 1 tablet (5 mg total) by mouth daily.  Dispense: 90 tablet; Refill: 1  Chronic hepatitis C without hepatic coma (HCC) -     Protime-INR; Future -     Basic metabolic panel; Future  Hyperlipidemia LDL goal <100 -     Lipid panel; Future -     Lipoprotein A (LPA); Future -     TSH; Future -     Basic metabolic panel; Future  Liver lesion, left lobe -     Hepatic function panel; Future -     Basic metabolic panel; Future  Thiamine deficiency -     CBC with Differential/Platelet; Future -     Basic metabolic panel; Future -      Vitamin B1; Future  Atypical mole -     Ambulatory referral to Dermatology     Follow-up: Return in about 6 months (around 08/22/2023).  Sanda Linger, MD

## 2023-02-19 NOTE — Patient Instructions (Signed)

## 2023-02-24 LAB — LIPOPROTEIN A (LPA): Lipoprotein (a): <10 nmol/L (ref <75)

## 2023-02-24 LAB — VITAMIN B1: Vitamin B1 (Thiamine): 10 nmol/L (ref 8–30)

## 2023-03-01 ENCOUNTER — Telehealth: Payer: Self-pay

## 2023-03-01 NOTE — Telephone Encounter (Signed)
Pharmacy Patient Advocate Encounter  Received notification from [facility] Medicaid that Prior Authorization for Tadalafil 5MG  tablets has been DENIED. Please advise how you'd like to proceed. Full denial letter will be uploaded to the media tab. See denial reason below.  This drug is not covered on the formulary. We are denying your request because we do not show that you have tried at least 2 covered drugs that ca treat your condition. Other covered drug(s) is/are: Alfuzosin hcl oral tablet extended release 24 hour 10mg , Doxazosin mesylate oral tablet (1mg , 2mg , 4mg , 8mg ), Dutasteride oral capsule 0.5mg , Finasteride oral tablet 5mg , Tamsulosin hcl oral capsule 0.4mg , Terazosin hcloral capsule (1mg , 2mg , 5mg , 10mg ).   PA #/Case ID/Reference #: [id number]   Please be advised we currently do not have a Pharmacist to review denials, therefore you will need to process appeals accordingly as needed. Thanks for your support at this time. Contact for appeals are as follows: Phone: [phone], Fax: [phone]

## 2023-05-01 ENCOUNTER — Encounter: Payer: Self-pay | Admitting: Dermatology

## 2023-05-01 ENCOUNTER — Ambulatory Visit (INDEPENDENT_AMBULATORY_CARE_PROVIDER_SITE_OTHER): Payer: Medicare Other | Admitting: Dermatology

## 2023-05-01 VITALS — BP 136/83 | HR 84

## 2023-05-01 DIAGNOSIS — L578 Other skin changes due to chronic exposure to nonionizing radiation: Secondary | ICD-10-CM

## 2023-05-01 DIAGNOSIS — D492 Neoplasm of unspecified behavior of bone, soft tissue, and skin: Secondary | ICD-10-CM | POA: Diagnosis not present

## 2023-05-01 DIAGNOSIS — D1801 Hemangioma of skin and subcutaneous tissue: Secondary | ICD-10-CM

## 2023-05-01 DIAGNOSIS — D229 Melanocytic nevi, unspecified: Secondary | ICD-10-CM

## 2023-05-01 DIAGNOSIS — D2261 Melanocytic nevi of right upper limb, including shoulder: Secondary | ICD-10-CM | POA: Diagnosis not present

## 2023-05-01 DIAGNOSIS — Z85828 Personal history of other malignant neoplasm of skin: Secondary | ICD-10-CM

## 2023-05-01 DIAGNOSIS — Z1283 Encounter for screening for malignant neoplasm of skin: Secondary | ICD-10-CM

## 2023-05-01 DIAGNOSIS — L821 Other seborrheic keratosis: Secondary | ICD-10-CM

## 2023-05-01 DIAGNOSIS — D485 Neoplasm of uncertain behavior of skin: Secondary | ICD-10-CM

## 2023-05-01 DIAGNOSIS — W908XXA Exposure to other nonionizing radiation, initial encounter: Secondary | ICD-10-CM

## 2023-05-01 DIAGNOSIS — L814 Other melanin hyperpigmentation: Secondary | ICD-10-CM

## 2023-05-01 NOTE — Progress Notes (Signed)
     New Patient Visit   Subjective  Micheal Doyle is a 67 y.o. male who presents for the following: Skin Cancer Screening and Upper Body Skin Exam  The patient presents for Upper Body Skin Exam (UBSE) for skin cancer screening and mole check. The patient has spots, moles and lesions to be evaluated, some may be new or changing and the patient may have concern these could be cancer. Pt has hx of BCC on the back around 20 years ago. No family hx of MM   The following portions of the chart were reviewed this encounter and updated as appropriate: medications, allergies, medical history  Review of Systems:  No other skin or systemic complaints except as noted in HPI or Assessment and Plan.  Objective  Well appearing patient in no apparent distress; mood and affect are within normal limits.  All skin waist up examined. Relevant physical exam findings are noted in the Assessment and Plan.  right clavicle Irregular brown macule         Assessment & Plan   Neoplasm of uncertain behavior of skin right clavicle  Skin / nail biopsy Type of biopsy: tangential   Instrument used: DermaBlade   Post-procedure details: wound care instructions given    Specimen 1 - Surgical pathology Differential Diagnosis: R/O DN vs MM vs SK  Check Margins: No  Skin cancer screening performed today.  Actinic Damage - Chronic condition, secondary to cumulative UV/sun exposure - diffuse scaly erythematous macules with underlying dyspigmentation - Recommend daily broad spectrum sunscreen SPF 30+ to sun-exposed areas, reapply every 2 hours as needed.  - Staying in the shade or wearing long sleeves, sun glasses (UVA+UVB protection) and wide brim hats (4-inch brim around the entire circumference of the hat) are also recommended for sun protection.  - Call for new or changing lesions.  Melanocytic Nevi - Tan-brown and/or pink-flesh-colored symmetric macules and papules - Benign appearing on exam  today - Observation - Call clinic for new or changing moles - Recommend daily use of broad spectrum spf 30+ sunscreen to sun-exposed areas.   SEBORRHEIC KERATOSIS - Stuck-on, waxy, tan-brown papules and/or plaques  - Benign-appearing - Discussed benign etiology and prognosis. - Observe - Call for any changes  LENTIGINES Exam: scattered tan macules Due to sun exposure Treatment Plan: Benign-appearing, observe. Recommend daily broad spectrum sunscreen SPF 30+ to sun-exposed areas, reapply every 2 hours as needed.  Call for any changes    HEMANGIOMA Exam: red papule(s) Discussed benign nature. Recommend observation. Call for changes.    Return in about 1 year (around 04/30/2024).  I, Tillie Fantasia, CMA, am acting as scribe for Gwenith Daily, MD.   Documentation: I have reviewed the above documentation for accuracy and completeness, and I agree with the above.  Gwenith Daily, MD

## 2023-05-01 NOTE — Patient Instructions (Addendum)
Patient Handout: Wound Care for Skin Biopsy Site  Taking Care of Your Skin Biopsy Site  Proper care of the biopsy site is essential for promoting healing and minimizing scarring. This handout provides instructions on how to care for your biopsy site to ensure optimal recovery.  1. Cleaning the Wound:  Clean the biopsy site daily with gentle soap and water. Gently pat the area dry with a clean, soft towel. Avoid harsh scrubbing or rubbing the area, as this can irritate the skin and delay healing.  2. Applying Aquaphor and Bandage:  After cleaning the wound, apply a thin layer of Aquaphor ointment to the biopsy site. Cover the area with a sterile bandage to protect it from dirt, bacteria, and friction. Change the bandage daily or as needed if it becomes soiled or wet.  3. Continued Care for One Week:  Repeat the cleaning, Aquaphor application, and bandaging process daily for one week following the biopsy procedure. Keeping the wound clean and moist during this initial healing period will help prevent infection and promote optimal healing.  4. Massaging Aquaphor into the Area:  ---After one week, discontinue the use of bandages but continue to apply Aquaphor to the biopsy site. ----Gently massage the Aquaphor into the area using circular motions. ---Massaging the skin helps to promote circulation and prevent the formation of scar tissue.   Additional Tips:  Avoid exposing the biopsy site to direct sunlight during the healing process, as this can cause hyperpigmentation or worsen scarring. If you experience any signs of infection, such as increased redness, swelling, warmth, or drainage from the wound, contact your healthcare provider immediately. Follow any additional instructions provided by your healthcare provider for caring for the biopsy site and managing any discomfort. Conclusion:  Taking proper care of your skin biopsy site is crucial for ensuring optimal healing and  minimizing scarring. By following these instructions for cleaning, applying Aquaphor, and massaging the area, you can promote a smooth and successful recovery. If you have any questions or concerns about caring for your biopsy site, don't hesitate to contact your healthcare provider for guidance.    Skin Education : We  counseled the patient regarding the following: Sun screen (SPF 30 or greater) should be applied during peak UV exposure (between 10am and 2pm) and reapplied after exercise or swimming.  The ABCDEs of melanoma were reviewed with the patient, and the importance of monthly self-examination of moles was emphasized. Should any moles change in shape or color, or itch, bleed or burn, pt will contact our office for evaluation sooner then their interval appointment.  Plan: Sunscreen Recommendations We recommended a broad spectrum sunscreen with a SPF of 30 or higher.  SPF 30 sunscreens block approximately 97 percent of the sun's harmful rays. Sunscreens should be applied at least 15 minutes prior to expected sun exposure and then every 2 hours after that as long as sun exposure continues. If swimming or exercising sunscreen should be reapplied every 45 minutes to an hour after getting wet or sweating. One ounce, or the equivalent of a shot glass full of sunscreen, is adequate to protect the skin not covered by a bathing suit. We also recommended a lip balm with a sunscreen as well. Sun protective clothing can be used in lieu of sunscreen but must be worn the entire time you are exposed to the sun's rays. Important Information   Due to recent changes in healthcare laws, you may see results of your pathology and/or laboratory studies on MyChart before  the doctors have had a chance to review them. We understand that in some cases there may be results that are confusing or concerning to you. Please understand that not all results are received at the same time and often the doctors may need to interpret  multiple results in order to provide you with the best plan of care or course of treatment. Therefore, we ask that you please give [place] 2 business days to thoroughly review all your results before contacting the office for clarification. Should we see a critical lab result, you will be contacted sooner.     If You Need Anything After Your Visit   If you have any questions or concerns for your doctor, please call our main line at [phone]. If no one answers, please leave a voicemail as directed and we will return your call as soon as possible. Messages left after 4 pm will be answered the following business day.    You may also send [place] a message via MyChart. We typically respond to MyChart messages within 1-2 business days.  For prescription refills, please ask your pharmacy to contact our office. Our fax number is [phone].  If you have an urgent issue when the clinic is closed that cannot wait until the next business day, you can page your doctor at the number below.     Please note that while we do our best to be available for urgent issues outside of office hours, we are not available 24/7.    If you have an urgent issue and are unable to reach [place], you may choose to seek medical care at your doctor's office, retail clinic, urgent care center, or emergency room.   If you have a medical emergency, please immediately call 911 or go to the emergency department. In the event of inclement weather, please call our main line at [phone] for an update on the status of any delays or closures.  Dermatology Medication Tips: Please keep the boxes that topical medications come in in order to help keep track of the instructions about where and how to use these. Pharmacies typically print the medication instructions only on the boxes and not directly on the medication tubes.   If your medication is too expensive, please contact our office at [phone] or send [place] a message through MyChart.     We are unable to tell what your co-pay for medications will be in advance as this is different depending on your insurance coverage. However, we may be able to find a substitute medication at lower cost or fill out paperwork to get insurance to cover a needed medication.    If a prior authorization is required to get your medication covered by your insurance company, please allow [place] 1-2 business days to complete this process.   Drug prices often vary depending on where the prescription is filled and some pharmacies may offer cheaper prices.   The website www.goodrx.com contains coupons for medications through different pharmacies. The prices here do not account for what the cost may be with help from insurance (it may be cheaper with your insurance), but the website can give you the price if you did not use any insurance.  - You can print the associated coupon and take it with your prescription to the pharmacy.  - You may also stop by our office during regular business hours and pick up a GoodRx coupon card.  - If you need your prescription sent electronically to a different pharmacy, notify  our office through [facility] or by phone at [phone]

## 2023-05-03 LAB — SURGICAL PATHOLOGY

## 2023-05-07 ENCOUNTER — Telehealth: Payer: Self-pay

## 2023-05-07 NOTE — Telephone Encounter (Signed)
-----   Message from [facility] sent at 05/07/2023  9:33 AM EDT ----- Please call patient to discuss results showing DN mod severe on right clavicle and get him scheduled for excision with me.  Thank you

## 2023-05-07 NOTE — Telephone Encounter (Signed)
Spoke with pt and gave him bx results and recommendations for excision

## 2023-06-04 ENCOUNTER — Ambulatory Visit: Payer: Medicare Other | Admitting: Dermatology

## 2023-06-04 ENCOUNTER — Encounter: Payer: Self-pay | Admitting: Dermatology

## 2023-06-04 VITALS — BP 125/77 | HR 87

## 2023-06-04 DIAGNOSIS — D2361 Other benign neoplasm of skin of right upper limb, including shoulder: Secondary | ICD-10-CM

## 2023-06-04 DIAGNOSIS — D239 Other benign neoplasm of skin, unspecified: Secondary | ICD-10-CM

## 2023-06-04 NOTE — Progress Notes (Signed)
   Follow-Up Visit   Subjective  Micheal Doyle is a 67 y.o. male who presents for the following: Excision of DN moderate/severe on right clavicle. Biopsied by Dr. Caralyn Guile.  The following portions of the chart were reviewed this encounter and updated as appropriate: medications, allergies, medical history  Review of Systems:  No other skin or systemic complaints except as noted in HPI or Assessment and Plan.  Objective  Well appearing patient in no apparent distress; mood and affect are within normal limits.  A focused examination was performed of the following areas: Right clavicle Relevant physical exam findings are noted in the Assessment and Plan.   Right clavicle           Assessment & Plan   Dysplastic nevus Right clavicle  Skin excision - Right clavicle  Lesion length (cm):  1.4 Lesion width (cm):  1 Margin per side (cm):  0.5 Total excision diameter (cm):  2.4 Informed consent: discussed and consent obtained   Timeout: patient name, date of birth, surgical site, and procedure verified   Procedure prep:  Patient was prepped and draped in usual sterile fashion Prep type:  Chlorhexidine Anesthesia: the lesion was anesthetized in a standard fashion   Anesthetic:  1% lidocaine w/ epinephrine 1-100,000 local infiltration Instrument used: #15 blade   Hemostasis achieved with comment:  4.0 monocryl, 5.0 fast absorbing Outcome: patient tolerated procedure well with no complications   Post-procedure details: sterile dressing applied and wound care instructions given   Additional details:  Final length 4.3  Skin repair - Right clavicle Complexity:  Intermediate Final length (cm):  4.3 Timeout: patient name, date of birth, surgical site, and procedure verified   Procedure prep:  Patient was prepped and draped in usual sterile fashion Prep type:  Chlorhexidine Anesthesia: the lesion was anesthetized in a standard fashion   Anesthetic:  1% lidocaine w/ epinephrine  1-100,000 local infiltration Reason for type of repair: reduce tension to allow closure, reduce the risk of dehiscence, infection, and necrosis, preserve normal anatomy and preserve normal anatomical and functional relationships   Undermining: edges undermined   Subcutaneous layers (deep stitches):  Suture size:  4-0 Suture type: Monocryl (poliglecaprone 25)   Stitches:  Buried vertical mattress Fine/surface layer approximation (top stitches):  Suture size:  5-0 Suture type: fast-absorbing plain gut   Stitches: simple running   Hemostasis achieved with: suture, pressure and electrodesiccation Outcome: patient tolerated procedure well with no complications   Post-procedure details: sterile dressing applied and wound care instructions given   Dressing type: pressure dressing, bacitracin and bandage    Specimen 1 - Surgical pathology Differential Diagnosis: DN severe atypia [id number] Check Margins: No    Return if symptoms worsen or fail to improve.  I, Tillie Fantasia, CMA, am acting as scribe for Gwenith Daily, MD.   Documentation: I have reviewed the above documentation for accuracy and completeness, and I agree with the above.  Gwenith Daily, MD

## 2023-06-04 NOTE — Patient Instructions (Signed)

## 2023-06-06 LAB — SURGICAL PATHOLOGY

## 2023-06-19 ENCOUNTER — Encounter: Payer: Self-pay | Admitting: Gastroenterology

## 2023-08-20 ENCOUNTER — Other Ambulatory Visit: Payer: Self-pay | Admitting: Internal Medicine

## 2023-08-20 DIAGNOSIS — Z8619 Personal history of other infectious and parasitic diseases: Secondary | ICD-10-CM

## 2023-08-21 ENCOUNTER — Ambulatory Visit: Payer: Self-pay | Admitting: Internal Medicine

## 2023-08-21 ENCOUNTER — Other Ambulatory Visit: Payer: Self-pay | Admitting: Internal Medicine

## 2023-08-21 DIAGNOSIS — Z8619 Personal history of other infectious and parasitic diseases: Secondary | ICD-10-CM

## 2023-08-21 NOTE — Telephone Encounter (Signed)
 Copied from CRM [id number]. Topic: Clinical - Red Word Triage >> Aug 21, 2023  8:08 AM Varney Gentleman wrote: [facility] that prompted transfer to Nurse Triage: Face is swollen and hurts and painful, it's in his eye. Has been using warm compresses  Chief Complaint: Needs RX refill on Acyclovir  Symptoms: pain and rash Frequency: constantly Pertinent Negatives: Patient denies other symptoms. Just want's medication called in .  Disposition: [] ED /[] Urgent Care (no appt availability in office) / [] Appointment(In office/virtual)/ []  [facility]/ [] Home Care/ [] Refused Recommended Disposition /[] [facility]/ [x]  Follow-up with PCP Additional Notes: Office to call patient back.  Reason for Disposition  [1] Caller requesting a prescription renewal (no refills left), no triage required, AND [2] triager able to renew prescription per department policy  Answer Assessment - Initial Assessment Questions 1. DRUG NAME: "What medicine do you need to have refilled?"     Acyclovir  2. REFILLS REMAINING: "How many refills are remaining?" (Note: The label on the medicine or pill bottle will show how many refills are remaining. If there are no refills remaining, then a renewal may be needed.)     0  Protocols used: Medication Refill and Renewal Call-A-AH

## 2023-08-21 NOTE — Telephone Encounter (Signed)
 Copied from CRM [id number]. Topic: Clinical - Prescription Issue >> Aug 21, 2023  8:13 AM Kita Perish H wrote: Reason for CRM: Patient called in to check status of medication refill for his valACYclovir  (VALTREX ) 500 MG tablet, patient states he was told yesterday while at the pharmacy that it was sent and takes some time to process. Refill looks like it's pending provider signature and patient states he really needs  medication, and is upset having issues with face swelling and pain patient was triaged. Please reach out to patient, thanks.  Autry Legions [phone]

## 2023-08-21 NOTE — Telephone Encounter (Signed)
 Refill request sent to provider.

## 2023-09-05 ENCOUNTER — Ambulatory Visit: Payer: Medicare Other

## 2023-09-05 VITALS — Ht 70.0 in | Wt 185.0 lb

## 2023-09-05 DIAGNOSIS — Z860100 Personal history of colon polyps, unspecified: Secondary | ICD-10-CM

## 2023-09-05 MED ORDER — SUFLAVE 178.7 G PO SOLR: 1.0000 | ORAL | 0 refills | Status: DC

## 2023-09-05 NOTE — Progress Notes (Signed)

## 2023-09-24 ENCOUNTER — Telehealth: Payer: Self-pay | Admitting: Gastroenterology

## 2023-09-24 NOTE — Telephone Encounter (Signed)
 Okay sorry to hear this, thanks for letting me know. Forwarding to charge nurse of [facility]

## 2023-09-24 NOTE — Telephone Encounter (Signed)
 Good morning Dr. Adela Lank,    Patient called stating that he would not be able to make his colonoscopy appointment with you on 2/20 with 3:30 due to not having transportation.   Patient was rescheduled for 3/26 at 2:30

## 2023-09-24 NOTE — Telephone Encounter (Addendum)
 Called patient to let him know that he will be getting new prep instruction with the updated procedure date and time. Patient has still not received the Suflave from his pharmacy. Advised patient to call the pharmacy for status update and call us back if he needs new prescription. Patient agreed to POC

## 2023-09-26 ENCOUNTER — Encounter: Payer: Medicare Other | Admitting: Gastroenterology

## 2023-10-23 ENCOUNTER — Encounter: Payer: Self-pay | Admitting: Gastroenterology

## 2023-10-29 ENCOUNTER — Other Ambulatory Visit: Payer: Self-pay

## 2023-10-29 ENCOUNTER — Telehealth: Payer: Self-pay | Admitting: Gastroenterology

## 2023-10-29 ENCOUNTER — Encounter: Payer: Self-pay | Admitting: *Deleted

## 2023-10-29 DIAGNOSIS — Z860100 Personal history of colon polyps, unspecified: Secondary | ICD-10-CM

## 2023-10-29 MED ORDER — NA SULFATE-K SULFATE-MG SULF 17.5-3.13-1.6 GM/177ML PO SOLN: 1.0000 | Freq: Once | ORAL | 0 refills | Status: AC

## 2023-10-29 NOTE — Telephone Encounter (Signed)
 Thank you Sarah.  If he does not answer the phone before 4, can leave a voicemail letting him know the prescription was sent, however if he goes to pick it up and they do not carry it, he could try a MiraLAX bowel prep that he could purchase over-the-counter if his pharmacy does not carry the Suflave.  Thanks a lot for your help with this.

## 2023-10-29 NOTE — Telephone Encounter (Signed)
 Okay thanks Sarah.  Can someone please call him to make sure that he can get it.  I have had issues with this particular prep being prescribed to the pharmacy and they do not carry it and the patient has not been able to get it.  If he cannot get that particular prep at the pharmacy I would use an alternative prep, any of them is fine with me so we can stay on track for tomorrow.  Can you please call him to make sure he gets this okay.  Thanks

## 2023-10-29 NOTE — Telephone Encounter (Signed)
 Attempted to return the patient's phone call to discuss the Prep Medication and reached his VM. It appears that [facility] delivered the [facility] to the patient's house for his 09/26/23 procedure that was rescheduled for tomorrow 10/30/23 and they also transferred that prescription to the [facility] for his procedure tomorrow.

## 2023-10-29 NOTE — Telephone Encounter (Signed)
 Attempted to call the patient back again to ensure that he could still have his procedure tomorrow and that the prep or an alternative could be sent to his pharmacy. Phone went to VM for the second time. Will try again before 4 pm.

## 2023-10-29 NOTE — Telephone Encounter (Signed)
 Patient called to reschedule his procedure for tomorrow stated he has tried contacting [facility] for his medication and was not successful also stated he was told it wold be sent to [facility] but they are also saying they do not have an order, therefore he would like to reschedule.

## 2023-10-30 ENCOUNTER — Encounter: Payer: Medicare Other | Admitting: Gastroenterology

## 2023-11-18 ENCOUNTER — Ambulatory Visit

## 2023-11-18 VITALS — Ht 70.0 in | Wt 185.0 lb

## 2023-11-18 DIAGNOSIS — Z0000 Encounter for general adult medical examination without abnormal findings: Secondary | ICD-10-CM

## 2023-11-18 DIAGNOSIS — Z122 Encounter for screening for malignant neoplasm of respiratory organs: Secondary | ICD-10-CM

## 2023-11-18 NOTE — Patient Instructions (Addendum)
 Mr. Sinn , Thank you for taking time to come for your Medicare Wellness Visit. I appreciate your ongoing commitment to your health goals. Please review the following plan we discussed and let me know if I can assist you in the future.   Referrals/Orders/Follow-Ups/Clinician Recommendations: It was nice talking with you today.  You are due for a tetanus vaccine and a Shingles vaccine.  An order has been placed for you to have a lung cancer screening.  Someone should be contacting you to schedule.  Aim for 30 minutes of exercise or brisk walking, 6-8 glasses of water, and 5 servings of fruits and vegetables each day.   This is a list of the screening recommended for you and due dates:  Health Maintenance  Topic Date Due   Zoster (Shingles) Vaccine (1 of 2) 02/14/1975   Screening for Lung Cancer  10/05/2021   COVID-19 Vaccine (3 - Pfizer risk series) 07/09/2022   DTaP/Tdap/Td vaccine (2 - Td or Tdap) 02/28/2023   Colon Cancer Screening  05/31/2023   Flu Shot  03/06/2024   Medicare Annual Wellness Visit  11/17/2024   Pneumonia Vaccine  Completed   Hepatitis C Screening  Completed   HPV Vaccine  Aged Out   Meningitis B Vaccine  Aged Out    Advanced directives: (Declined) Advance directive discussed with you today. Even though you declined this today, please call our office should you change your mind, and we can give you the proper paperwork for you to fill out.  Next Medicare Annual Wellness Visit scheduled for next year: Yes

## 2023-11-18 NOTE — Progress Notes (Signed)
 Subjective:   Micheal Doyle is a 68 y.o. who presents for a Medicare Wellness preventive visit.  Visit Complete: Virtual I connected with  Micheal Doyle on 11/18/23 by a audio enabled telemedicine application and verified that I am speaking with the correct person using two identifiers.  Patient Location: Home  Provider Location: Home Office  I discussed the limitations of evaluation and management by telemedicine. The patient expressed understanding and agreed to proceed.  Vital Signs: Because this visit was a virtual/telehealth visit, some criteria may be missing or patient reported. Any vitals not documented were not able to be obtained and vitals that have been documented are patient reported.  VideoDeclined- This patient declined Librarian, academic. Therefore the visit was completed with audio only.  Persons Participating in Visit: Patient.  AWV Questionnaire: No: Patient Medicare AWV questionnaire was not completed prior to this visit.  Cardiac Risk Factors include: advanced age (>61men, >69 women);male gender;dyslipidemia     Objective:    Today's Vitals   11/18/23 0802  Weight: 185 lb (83.9 kg)  Height: 5\' 10"  (1.778 m)   Body mass index is 26.54 kg/m.     11/18/2023    8:18 AM 07/10/2022    8:49 AM  Advanced Directives  Does Patient Have a Medical Advance Directive? No Yes  Type of Special educational needs teacher of [place];Living will  Copy of Healthcare Power of Attorney in Chart?  No - copy requested    Current Medications (verified) Outpatient Encounter Medications as of 11/18/2023  Medication Sig   pravastatin (PRAVACHOL) 10 MG tablet Take 1 tablet (10 mg total) by mouth daily. (Patient not taking: Reported on 11/18/2023)   Rimegepant Sulfate (NURTEC) 75 MG TBDP Take 1 tablet by mouth every other day. (Patient not taking: Reported on 11/18/2023)   tadalafil (CIALIS) 5 MG tablet Take 1 tablet (5 mg total) by mouth daily.    thiamine (VITAMIN B-1) 50 MG tablet Take 1 tablet (50 mg total) by mouth every other day. (Patient not taking: Reported on 11/18/2023)   valACYclovir (VALTREX) 500 MG tablet Take 1 tablet (500 mg total) by mouth daily. (Patient not taking: Reported on 11/18/2023)   No facility-administered encounter medications on file as of 11/18/2023.    Allergies (verified) Crestor [rosuvastatin]   History: Past Medical History:  Diagnosis Date   Anemia    Cataract    bilateral - MD just watching    Chronic hepatitis C (HCC)    treated - now neg per patient   Cirrhosis of liver (HCC)    Cluster headache syndrome 1995   Former smoker    quit 02/2018   Herpes simplex eyelid dermatitis    History of basal cell cancer    on back/ over 10 years ago   Hyperlipidemia    Past Surgical History:  Procedure Laterality Date   COLONOSCOPY  2014   Dr Miguel Rota TOOTH EXTRACTION     Family History  Problem Relation Age of Onset   Rectal cancer Paternal Aunt    Cancer Neg Hx    Diabetes Neg Hx    Alcohol abuse Neg Hx    Depression Neg Hx    Drug abuse Neg Hx    Early death Neg Hx    Hearing loss Neg Hx    Heart disease Neg Hx    Hyperlipidemia Neg Hx    Hypertension Neg Hx    Kidney disease Neg Hx  Learning disabilities Neg Hx    Stroke Neg Hx    Stomach cancer Neg Hx    Colon cancer Neg Hx    Social History   Socioeconomic History   Marital status: Divorced    Spouse name: Not on file   Number of children: Not on file   Years of education: Not on file   Highest education level: Not on file  Occupational History   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   Social Drivers of Corporate investment banker Strain: Low Risk  (11/18/2023)   Overall Financial Resource Strain (CARDIA)    Difficulty of Paying Living Expenses: Not hard at all  Food Insecurity: No Food Insecurity (11/18/2023)   Hunger Vital Sign    Worried About Running Out of Food in the Last Year: Never true    Ran Out of Food in the Last Year: Never true  Transportation Needs: No Transportation Needs (11/18/2023)   PRAPARE - Administrator, Civil Service (Medical): No    Lack of Transportation (Non-Medical): No  Physical Activity: Sufficiently Active (11/18/2023)   Exercise Vital Sign    Days of Exercise per Week: 6 days    Minutes of Exercise per Session: 40 min  Stress: No Stress Concern Present (11/18/2023)   [employer] of Occupational Health - Occupational Stress Questionnaire    Feeling of Stress : Not at all  Social Connections: Socially Isolated (11/18/2023)   Social Connection and Isolation Panel [NHANES]    Frequency of Communication with Friends and Family: Once a week    Frequency of Social Gatherings with Friends and Family: Once a week    Attends Religious Services: Never    Database administrator or Organizations: No    Attends Engineer, structural: Never    Marital Status: Divorced    Tobacco Counseling Counseling given: Not Answered    Clinical Intake:  Pre-visit preparation completed: Yes  Pain : No/denies pain     BMI - recorded: 26.54 Nutritional Status: BMI 25 -29 Overweight Nutritional Risks: None Diabetes: No  Lab Results  Component Value Date   HGBA1C 5.8 (H) 02/12/2020     How often do you need to have someone help you when you read instructions, pamphlets, or other  written materials from your doctor or pharmacy?: 1 - Never  Interpreter Needed?: No  Information entered by :: Kemari Mares, RMA   Activities of Daily Living     11/18/2023    8:03 AM  In your present state of health, do you have any difficulty performing the following activities:  Hearing? 0  Vision? 0  Difficulty concentrating or making decisions? 0  Walking or climbing stairs? 0  Dressing or bathing? 0  Doing errands, shopping? 0  Preparing Food  and eating ? N  Using the Toilet? N  In the past six months, have you accidently leaked urine? N  Do you have problems with loss of bowel control? N  Managing your Medications? N  Managing your Finances? N  Housekeeping or managing your Housekeeping? N    Patient Care Team: Etta Grandchild, MD as PCP - General (Internal Medicine) [facility] as Consulting Physician (Ophthalmology) Armbruster, Willaim Rayas, MD as Consulting Physician (Gastroenterology)  Indicate any recent Medical Services you may have received from other than [facility] providers in the past year (date may be approximate).     Assessment:   This is a routine wellness examination for Aland.  Hearing/Vision screen Hearing Screening - Comments:: Denies hearing difficulties   Vision Screening - Comments:: Denies vision issues.    Goals Addressed             This Visit's Progress    To maintain my current health status by continuing to eat healthy, stay physically active and socially active.   On track      Depression Screen     11/18/2023    8:22 AM 07/10/2022    8:52 AM 07/17/2021    3:20 PM 02/11/2020    4:09 PM 02/26/2018    1:11 PM 06/05/2016   10:40 AM 06/17/2013    3:21 PM  PHQ 2/9 Scores  PHQ - 2 Score 0 0 0 0 0 0 0  PHQ- 9 Score 0          Fall Risk     11/18/2023    8:19 AM 07/10/2022    8:50 AM 07/17/2021    3:20 PM 02/11/2020    4:08 PM 06/05/2016   10:40 AM  Fall Risk   Falls in the past year? 0 0 0 0 No  Number falls in  past yr: 0 0  0   Injury with Fall? 0 0  0   Risk for fall due to : No Fall Risks No Fall Risks  No Fall Risks   Follow up Falls prevention discussed;Falls evaluation completed Falls prevention discussed  Falls evaluation completed     MEDICARE RISK AT HOME:  Medicare Risk at Home Any stairs in or around the home?: Yes If so, are there any without handrails?: Yes Home free of loose throw rugs in walkways, pet beds, electrical cords, etc?: Yes Adequate lighting in your home to reduce risk of falls?: Yes Life alert?: No Use of a cane, walker or w/c?: No Grab bars in the bathroom?: No Shower chair or bench in shower?: No Elevated toilet seat or a handicapped toilet?: No  TIMED UP AND GO:  Was the test performed?  No  Cognitive Function: 6CIT completed        11/18/2023    8:19 AM 07/10/2022    8:59 AM  6CIT Screen  What Year? 0 points 0 points  What month? 0 points 0 points  What time? 0 points 0 points  Count back from 20 0 points 0 points  Months in reverse 0 points 0 points  Repeat phrase 0 points 0 points  Total Score 0 points 0 points    Immunizations Immunization History  Administered Date(s) Administered   Fluad Quad(high Dose 65+) 07/17/2021, 06/11/2022   Hepatitis B, ADULT 06/05/2016, 07/09/2016, 12/05/2016   Influenza,inj,Quad PF,6+ Mos 06/17/2013, 07/08/2014, 04/02/2016   PFIZER Comirnaty(Gray Top)Covid-19 Tri-Sucrose Vaccine 06/11/2022   PFIZER(Purple Top)SARS-COV-2 Vaccination 04/26/2021   PNEUMOCOCCAL CONJUGATE-20 07/17/2021  Pneumococcal Polysaccharide-23 06/17/2013   Tdap 02/27/2013   Zoster, Live 07/08/2014    Screening Tests Health Maintenance  Topic Date Due   Zoster Vaccines- Shingrix (1 of 2) 02/14/1975   Lung Cancer Screening  10/05/2021   COVID-19 Vaccine (3 - Pfizer risk series) 07/09/2022   DTaP/Tdap/Td (2 - Td or Tdap) 02/28/2023   Colonoscopy  05/31/2023   INFLUENZA VACCINE  03/06/2024   Medicare Annual Wellness (AWV)  11/17/2024    Pneumonia Vaccine 7+ Years old  Completed   Hepatitis C Screening  Completed   HPV VACCINES  Aged Out   Meningococcal B Vaccine  Aged Out    Health Maintenance  Health Maintenance Due  Topic Date Due   Zoster Vaccines- Shingrix (1 of 2) 02/14/1975   Lung Cancer Screening  10/05/2021   COVID-19 Vaccine (3 - Pfizer risk series) 07/09/2022   DTaP/Tdap/Td (2 - Td or Tdap) 02/28/2023   Colonoscopy  05/31/2023   Health Maintenance Items Addressed: Lung Cancer Screening ordered, See Nurse Notes  Additional Screening:  Vision Screening: Recommended annual ophthalmology exams for early detection of glaucoma and other disorders of the eye.  Dental Screening: Recommended annual dental exams for proper oral hygiene  Community Resource Referral / Chronic Care Management: CRR required this visit?  No   CCM required this visit?  No     Plan:     I have personally reviewed and noted the following in the patient's chart:   Medical and social history Use of alcohol, tobacco or illicit drugs  Current medications and supplements including opioid prescriptions. Patient is not currently taking opioid prescriptions. Functional ability and status Nutritional status Physical activity Advanced directives List of other physicians Hospitalizations, surgeries, and ER visits in previous 12 months Vitals Screenings to include cognitive, depression, and falls Referrals and appointments  In addition, I have reviewed and discussed with patient certain preventive protocols, quality metrics, and best practice recommendations. A written personalized care plan for preventive services as well as general preventive health recommendations were provided to patient.     Kaijah Abts L Dorris Vangorder, CMA   11/18/2023   After Visit Summary: (MyChart) Due to this being a telephonic visit, the after visit summary with patients personalized plan was offered to patient via MyChart   Notes: Please refer to Routing  Comments.

## 2023-12-09 ENCOUNTER — Encounter: Payer: Self-pay | Admitting: Gastroenterology

## 2023-12-09 ENCOUNTER — Ambulatory Visit (AMBULATORY_SURGERY_CENTER): Admitting: Gastroenterology

## 2023-12-09 VITALS — BP 114/78 | HR 75 | Temp 98.4°F | Resp 12 | Ht 70.0 in | Wt 185.0 lb

## 2023-12-09 DIAGNOSIS — Z860101 Personal history of adenomatous and serrated colon polyps: Secondary | ICD-10-CM | POA: Diagnosis not present

## 2023-12-09 DIAGNOSIS — K648 Other hemorrhoids: Secondary | ICD-10-CM | POA: Diagnosis not present

## 2023-12-09 DIAGNOSIS — Z860100 Personal history of colon polyps, unspecified: Secondary | ICD-10-CM

## 2023-12-09 DIAGNOSIS — Z1211 Encounter for screening for malignant neoplasm of colon: Secondary | ICD-10-CM | POA: Diagnosis not present

## 2023-12-09 MED ORDER — SODIUM CHLORIDE 0.9 % IV SOLN: 500.0000 mL | Freq: Once | INTRAVENOUS | Status: DC

## 2023-12-09 NOTE — Progress Notes (Signed)
 [facility] History and Physical   Primary Care Physician:  Arcadio Knuckles, MD   Reason for Procedure:   History of colon polyps  Plan:    colonoscopy     HPI: Micheal Doyle is a 68 y.o. male  here for colonoscopy surveillance - last exam 05/2020 - 6 polyps removed.   Patient denies any bowel symptoms at this time. Otherwise feels well without any cardiopulmonary symptoms.   I have discussed risks / benefits of anesthesia and endoscopic procedure with Micheal Doyle and they wish to proceed with the exams as outlined today.    Past Medical History:  Diagnosis Date   Anemia    Cataract    bilateral - MD just watching    Chronic hepatitis C (HCC)    treated - now neg per patient   Cirrhosis of liver (HCC)    Cluster headache syndrome 1995   Former smoker    quit 02/2018   Herpes simplex eyelid dermatitis    History of basal cell cancer    on back/ over 10 years ago   Hyperlipidemia     Past Surgical History:  Procedure Laterality Date   COLONOSCOPY  2014   Dr Alayne Hubert TOOTH EXTRACTION      Prior to Admission medications   Medication Sig Start Date End Date Taking? Authorizing Provider  pravastatin  (PRAVACHOL ) 10 MG tablet Take 1 tablet (10 mg total) by mouth daily. Patient not taking: Reported on 09/05/2023 02/19/23   Arcadio Knuckles, MD  Rimegepant Sulfate (NURTEC) 75 MG TBDP Take 1 tablet by mouth every other day. Patient not taking: Reported on 09/05/2023 03/22/20   Arcadio Knuckles, MD  tadalafil  (CIALIS ) 5 MG tablet Take 1 tablet (5 mg total) by mouth daily. 02/19/23   Arcadio Knuckles, MD  thiamine (VITAMIN B-1) 50 MG tablet Take 1 tablet (50 mg total) by mouth every other day. Patient not taking: Reported on 09/05/2023 02/17/20   Arcadio Knuckles, MD  valACYclovir  (VALTREX ) 500 MG tablet Take 1 tablet (500 mg total) by mouth daily. Patient not taking: Reported on 09/05/2023 07/06/22   Arcadio Knuckles, MD    Current Outpatient Medications   Medication Sig Dispense Refill   pravastatin  (PRAVACHOL ) 10 MG tablet Take 1 tablet (10 mg total) by mouth daily. (Patient not taking: Reported on 09/05/2023) 90 tablet 1   Rimegepant Sulfate (NURTEC) 75 MG TBDP Take 1 tablet by mouth every other day. (Patient not taking: Reported on 09/05/2023) 16 tablet 5   tadalafil  (CIALIS ) 5 MG tablet Take 1 tablet (5 mg total) by mouth daily. 90 tablet 1   thiamine (VITAMIN B-1) 50 MG tablet Take 1 tablet (50 mg total) by mouth every other day. (Patient not taking: Reported on 09/05/2023) 45 tablet 1   valACYclovir  (VALTREX ) 500 MG tablet Take 1 tablet (500 mg total) by mouth daily. (Patient not taking: Reported on 09/05/2023) 90 tablet 0   Current Facility-Administered Medications  Medication Dose Route Frequency Provider Last Rate Last Admin   0.9 %  sodium chloride  infusion  500 mL Intravenous Once Rykar Lebleu, Lendon Queen, MD        Allergies as of 12/09/2023 - Review Complete 12/09/2023  Allergen Reaction Noted   Crestor  [rosuvastatin ] Other (See Comments) 07/17/2021    Family History  Problem Relation Age of Onset   Rectal cancer Paternal Aunt    Cancer Neg Hx    Diabetes Neg Hx    Alcohol  abuse Neg Hx    Depression Neg Hx    Drug abuse Neg Hx    Early death Neg Hx    Hearing loss Neg Hx    Heart disease Neg Hx    Hyperlipidemia Neg Hx    Hypertension Neg Hx    Kidney disease Neg Hx    Learning disabilities Neg Hx    Stroke Neg Hx    Stomach cancer Neg Hx    Colon cancer Neg Hx     Social History   Socioeconomic History   Marital status: Divorced    Spouse name: Not on file   Number of children: Not on file   Years of education: Not on file   Highest education level: Not on file  Occupational History   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   Social Drivers of Corporate investment banker Strain: Low Risk  (11/18/2023)   Overall Financial Resource Strain (CARDIA)    Difficulty of Paying Living Expenses: Not hard at all  Food Insecurity: No Food Insecurity (11/18/2023)   Hunger Vital Sign    Worried About Running Out of Food in the Last Year: Never true    Ran Out of Food in the Last Year: Never true  Transportation Needs: No Transportation Needs (11/18/2023)   PRAPARE - Administrator, Civil Service (Medical): No    Lack of Transportation (Non-Medical): No  Physical Activity: Sufficiently Active (11/18/2023)   Exercise Vital Sign    Days of Exercise per Week: 6 days    Minutes of Exercise per Session: 40 min  Stress: No Stress Concern Present (11/18/2023)   [employer] of Occupational Health - Occupational Stress Questionnaire    Feeling of Stress : Not at all  Social Connections: Socially Isolated (11/18/2023)   Social Connection and Isolation Panel [NHANES]    Frequency of Communication with Friends and Family: Once a week    Frequency of Social Gatherings with Friends and Family: Once a week    Attends Religious Services: Never    Database administrator or Organizations: No    Attends Banker Meetings: Never    Marital Status: Divorced  Catering manager Violence: Patient Unable To Answer (11/18/2023)   Humiliation, Afraid, Rape, and Kick questionnaire    Fear of Current or Ex-Partner: Patient unable to answer    Emotionally Abused: Patient unable to answer    Physically Abused: Patient unable to answer    Sexually  Abused: Patient unable to answer    Review of Systems: All other review of systems negative except as mentioned in the HPI.  Physical Exam: Vital signs BP 134/76   Pulse 77   Temp 98.4 F (36.9 C)   Resp (!) 8   Ht 5\' 10"  (1.778  m)   Wt 185 lb (83.9 kg)   SpO2 96%   BMI 26.54 kg/m   General:   Alert,  Well-developed, pleasant and cooperative in NAD Lungs:  Clear throughout to auscultation.   Heart:  Regular rate and rhythm Abdomen:  Soft, nontender and nondistended.   Neuro/Psych:  Alert and cooperative. Normal mood and affect. A and O x 3  Christi Coward, MD [facility]

## 2023-12-09 NOTE — Progress Notes (Signed)
 Pt's states no medical or surgical changes since previsit or office visit.

## 2023-12-09 NOTE — Patient Instructions (Signed)

## 2023-12-09 NOTE — Op Note (Signed)
 [facility] Patient Name: Micheal Doyle Procedure Date: 12/09/2023 3:33 PM MRN: [id number] Endoscopist: Landon Pinion P. General Kenner , MD, [id number] Age: 68 Referring MD:  Date of Birth: [birth year] Gender: Male Account #: [id number] Procedure:                Colonoscopy Indications:              High risk colon cancer surveillance: Personal                            history of colonic polyps - 6 polyps removed                            05/2020 - most adenomas / sessile serrated Medicines:                Monitored Anesthesia Care Procedure:                Pre-Anesthesia Assessment:                           - Prior to the procedure, a History and Physical                            was performed, and patient medications and                            allergies were reviewed. The patient's tolerance of                            previous anesthesia was also reviewed. The risks                            and benefits of the procedure and the sedation                            options and risks were discussed with the patient.                            All questions were answered, and informed consent                            was obtained. Prior Anticoagulants: The patient has                            taken no anticoagulant or antiplatelet agents. ASA                            Grade Assessment: II - A patient with mild systemic                            disease. After reviewing the risks and benefits,                            the patient was deemed in satisfactory condition to  undergo the procedure.                           After obtaining informed consent, the colonoscope                            was passed under direct vision. Throughout the                            procedure, the patient's blood pressure, pulse, and                            oxygen saturations were monitored continuously. The                            Olympus Scope SN  [id number] was introduced through the                            anus and advanced to the the cecum, identified by                            appendiceal orifice and ileocecal valve. The                            colonoscopy was performed without difficulty. The                            patient tolerated the procedure well. The quality                            of the bowel preparation was good. The ileocecal                            valve, appendiceal orifice, and rectum were                            photographed. Scope In: 3:42:16 PM Scope Out: 3:55:43 PM Scope Withdrawal Time: 0 hours 10 minutes 7 seconds  Total Procedure Duration: 0 hours 13 minutes 27 seconds  Findings:                 The perianal and digital rectal examinations were                            normal.                           Internal hemorrhoids were found during retroflexion.                           The exam was otherwise without abnormality. Complications:            No immediate complications. Estimated blood loss:                            None. Estimated Blood Loss:  Estimated blood loss: none. Impression:               - Small internal hemorrhoids.                           - The examination was otherwise normal.                           - No polyps Recommendation:           - Patient has a contact number available for                            emergencies. The signs and symptoms of potential                            delayed complications were discussed with the                            patient. Return to normal activities tomorrow.                            Written discharge instructions were provided to the                            patient.                           - Resume previous diet.                           - Continue present medications.                           - Repeat colonoscopy in 5 years for surveillance                            given history of polyps on the last  few exams,                            burden of polyps in 2021 Quintel Mccalla P. Shady Bradish, MD 12/09/2023 4:00:47 PM This report has been signed electronically.

## 2023-12-09 NOTE — Progress Notes (Signed)
 To pacu, VSS. Report to Rn.tb

## 2023-12-10 ENCOUNTER — Telehealth: Payer: Self-pay

## 2023-12-10 NOTE — Telephone Encounter (Signed)
  Follow up Call-     12/09/2023    2:39 PM  Call back number  Post procedure Call Back phone  # [phone]  Permission to leave phone message No     Patient questions:  Do you have a fever, pain , or abdominal swelling? No. Pain Score  0   Have you tolerated food without any problems? Yes.    Have you been able to return to your normal activities? Yes.    Do you have any questions about your discharge instructions: Diet   No. Medications  No. Follow up visit  No.  Do you have questions or concerns about your Care? No.  Actions: * If pain score is 4 or above: No action needed, pain <4.

## 2023-12-11 ENCOUNTER — Ambulatory Visit: Admitting: Internal Medicine

## 2024-01-06 ENCOUNTER — Ambulatory Visit: Admitting: Internal Medicine

## 2024-01-06 ENCOUNTER — Encounter: Payer: Self-pay | Admitting: Internal Medicine

## 2024-01-06 VITALS — BP 124/70 | HR 84 | Temp 98.5°F | Resp 16 | Ht 70.0 in | Wt 185.6 lb

## 2024-01-06 DIAGNOSIS — R351 Nocturia: Secondary | ICD-10-CM

## 2024-01-06 DIAGNOSIS — E519 Thiamine deficiency, unspecified: Secondary | ICD-10-CM | POA: Diagnosis not present

## 2024-01-06 DIAGNOSIS — N401 Enlarged prostate with lower urinary tract symptoms: Secondary | ICD-10-CM | POA: Diagnosis not present

## 2024-01-06 DIAGNOSIS — Z0000 Encounter for general adult medical examination without abnormal findings: Secondary | ICD-10-CM

## 2024-01-06 DIAGNOSIS — R911 Solitary pulmonary nodule: Secondary | ICD-10-CM | POA: Diagnosis not present

## 2024-01-06 DIAGNOSIS — R918 Other nonspecific abnormal finding of lung field: Secondary | ICD-10-CM | POA: Diagnosis not present

## 2024-01-06 DIAGNOSIS — D539 Nutritional anemia, unspecified: Secondary | ICD-10-CM | POA: Diagnosis not present

## 2024-01-06 DIAGNOSIS — K769 Liver disease, unspecified: Secondary | ICD-10-CM

## 2024-01-06 DIAGNOSIS — E785 Hyperlipidemia, unspecified: Secondary | ICD-10-CM

## 2024-01-06 DIAGNOSIS — Z0001 Encounter for general adult medical examination with abnormal findings: Secondary | ICD-10-CM | POA: Insufficient documentation

## 2024-01-06 DIAGNOSIS — Z8619 Personal history of other infectious and parasitic diseases: Secondary | ICD-10-CM

## 2024-01-06 DIAGNOSIS — Z23 Encounter for immunization: Secondary | ICD-10-CM | POA: Insufficient documentation

## 2024-01-06 DIAGNOSIS — Z136 Encounter for screening for cardiovascular disorders: Secondary | ICD-10-CM | POA: Diagnosis not present

## 2024-01-06 DIAGNOSIS — D7389 Other diseases of spleen: Secondary | ICD-10-CM | POA: Diagnosis not present

## 2024-01-06 LAB — CBC WITH DIFFERENTIAL/PLATELET
Basophils Absolute: 0.1 10*3/uL (ref 0.0–0.1)
Basophils Relative: 1.1 % (ref 0.0–3.0)
Eosinophils Absolute: 0.1 10*3/uL (ref 0.0–0.7)
Eosinophils Relative: 2.5 % (ref 0.0–5.0)
HCT: 35.1 % — ABNORMAL LOW (ref 39.0–52.0)
Hemoglobin: 12.2 g/dL — ABNORMAL LOW (ref 13.0–17.0)
Lymphocytes Relative: 31.8 % (ref 12.0–46.0)
Lymphs Abs: 1.7 10*3/uL (ref 0.7–4.0)
MCHC: 34.7 g/dL (ref 30.0–36.0)
MCV: 102.1 fl — ABNORMAL HIGH (ref 78.0–100.0)
Monocytes Absolute: 0.3 10*3/uL (ref 0.1–1.0)
Monocytes Relative: 5.5 % (ref 3.0–12.0)
Neutro Abs: 3.1 10*3/uL (ref 1.4–7.7)
Neutrophils Relative %: 59.1 % (ref 43.0–77.0)
Platelets: 347 10*3/uL (ref 150.0–400.0)
RBC: 3.44 Mil/uL — ABNORMAL LOW (ref 4.22–5.81)
RDW: 14.6 % (ref 11.5–15.5)
WBC: 5.2 10*3/uL (ref 4.0–10.5)

## 2024-01-06 LAB — LIPID PANEL
Cholesterol: 133 mg/dL (ref 0–200)
HDL: 35 mg/dL — ABNORMAL LOW (ref >39.00)
LDL Cholesterol: 70 mg/dL (ref 0–99)
NonHDL: 97.7
Total CHOL/HDL Ratio: 4
Triglycerides: 141 mg/dL (ref 0.0–149.0)
VLDL: 28.2 mg/dL (ref 0.0–40.0)

## 2024-01-06 LAB — URINALYSIS, ROUTINE W REFLEX MICROSCOPIC
Bilirubin Urine: NEGATIVE
Hgb urine dipstick: NEGATIVE
Ketones, ur: NEGATIVE
Leukocytes,Ua: NEGATIVE
Nitrite: NEGATIVE
Specific Gravity, Urine: 1.025 (ref 1.000–1.030)
Total Protein, Urine: NEGATIVE
Urine Glucose: NEGATIVE
Urobilinogen, UA: 0.2 (ref 0.0–1.0)
pH: 6 (ref 5.0–8.0)

## 2024-01-06 LAB — BASIC METABOLIC PANEL WITH GFR
BUN: 16 mg/dL (ref 6–23)
CO2: 26 meq/L (ref 19–32)
Calcium: 9.3 mg/dL (ref 8.4–10.5)
Chloride: 105 meq/L (ref 96–112)
Creatinine, Ser: 0.81 mg/dL (ref 0.40–1.50)
GFR: 90.99 mL/min (ref >60.00)
Glucose, Bld: 95 mg/dL (ref 70–99)
Potassium: 4 meq/L (ref 3.5–5.1)
Sodium: 138 meq/L (ref 135–145)

## 2024-01-06 LAB — HEPATIC FUNCTION PANEL
ALT: 17 U/L (ref 0–53)
AST: 15 U/L (ref 0–37)
Albumin: 4.7 g/dL (ref 3.5–5.2)
Alkaline Phosphatase: 46 U/L (ref 39–117)
Bilirubin, Direct: 0.2 mg/dL (ref 0.0–0.3)
Total Bilirubin: 0.6 mg/dL (ref 0.2–1.2)
Total Protein: 6.9 g/dL (ref 6.0–8.3)

## 2024-01-06 LAB — PSA: PSA: 0.25 ng/mL (ref 0.10–4.00)

## 2024-01-06 MED ORDER — SHINGRIX 50 MCG/0.5ML IM SUSR: 0.5000 mL | Freq: Once | INTRAMUSCULAR | 1 refills | Status: AC

## 2024-01-06 MED ORDER — BOOSTRIX 5-2.5-18.5 LF-MCG/0.5 IM SUSP: 0.5000 mL | Freq: Once | INTRAMUSCULAR | 0 refills | Status: AC

## 2024-01-06 MED ORDER — VALACYCLOVIR HCL 500 MG PO TABS: 500.0000 mg | ORAL_TABLET | Freq: Every day | ORAL | 0 refills | Status: AC

## 2024-01-06 NOTE — Progress Notes (Unsigned)
 Subjective:  Patient ID: Micheal Doyle, male    DOB: Jan 24, 1956  Age: 68 y.o. MRN: [id number]  CC: Medical Management of Chronic Issues (6 month follow up. No concerns ) and Annual Exam   HPI Micheal Doyle presents for a CPX and f/up -----  Discussed the use of AI scribe software for clinical note transcription with the patient, who gave verbal consent to proceed.  History of Present Illness   Micheal Doyle is a 68 year old male who presents for a follow-up regarding herpes simplex virus affecting his eye.  He experiences swelling and pain in his eye, which he associates with an outbreak of herpes simplex virus. He can usually predict when an outbreak is coming due to these symptoms. He has previously used Valtrex  to manage these outbreaks, noting that if he takes it on the day he senses an outbreak, it prevents the condition from worsening. However, he has encountered difficulties obtaining the medication from the pharmacy, as it is often not available when needed.  He has not received the new shingles vaccine, although he has had a previous shingles vaccine administered by his healthcare provider.  No dizziness, lightheadedness, chest pain, or shortness of breath. He reports that his urination has slowed down a little bit over the past five years.  He identifies as a nonsmoker and nondrinker. No pain or swelling in the genital area.       Outpatient Medications Prior to Visit  Medication Sig Dispense Refill  . tadalafil  (CIALIS ) 5 MG tablet Take 1 tablet (5 mg total) by mouth daily. 90 tablet 1  . valACYclovir  (VALTREX ) 500 MG tablet Take 1 tablet (500 mg total) by mouth daily. 90 tablet 0  . Rimegepant Sulfate (NURTEC) 75 MG TBDP Take 1 tablet by mouth every other day. (Patient not taking: Reported on 01/06/2024) 16 tablet 5  . pravastatin  (PRAVACHOL ) 10 MG tablet Take 1 tablet (10 mg total) by mouth daily. (Patient not taking: Reported on 01/06/2024) 90 tablet 1  . thiamine (VITAMIN  B-1) 50 MG tablet Take 1 tablet (50 mg total) by mouth every other day. (Patient not taking: Reported on 01/06/2024) 45 tablet 1  . 0.9 %  sodium chloride  infusion      No facility-administered medications prior to visit.    ROS Review of Systems  Constitutional:  Negative for appetite change, chills, diaphoresis, fatigue and fever.  HENT: Negative.    Eyes: Negative.  Negative for redness and visual disturbance.  Respiratory: Negative.  Negative for cough, chest tightness, shortness of breath and wheezing.   Cardiovascular:  Negative for chest pain, palpitations and leg swelling.  Gastrointestinal: Negative.  Negative for abdominal pain, constipation, diarrhea, nausea and vomiting.  Genitourinary: Negative.  Negative for difficulty urinating and dysuria.  Musculoskeletal: Negative.  Negative for arthralgias and myalgias.  Skin: Negative.  Negative for color change and rash.  Neurological:  Negative for dizziness, weakness and light-headedness.  Hematological:  Negative for adenopathy. Does not bruise/bleed easily.  Psychiatric/Behavioral: Negative.      Objective:  BP 124/70 (BP Location: Left Arm, Patient Position: Sitting, Cuff Size: Normal)   Pulse 84   Temp 98.5 F (36.9 C) (Oral)   Resp 16   Ht 5\' 10"  (1.778 m)   Wt 185 lb 9.6 oz (84.2 kg)   SpO2 95%   BMI 26.63 kg/m   BP Readings from Last 3 Encounters:  01/06/24 124/70  12/09/23 114/78  06/04/23 125/77    Wt Readings  from Last 3 Encounters:  01/06/24 185 lb 9.6 oz (84.2 kg)  12/09/23 185 lb (83.9 kg)  11/18/23 185 lb (83.9 kg)    Physical Exam Vitals reviewed.  Constitutional:      Appearance: Normal appearance.  HENT:     Nose: Nose normal.     Mouth/Throat:     Mouth: Mucous membranes are moist.  Eyes:     General: No scleral icterus.    Conjunctiva/sclera: Conjunctivae normal.     Right eye: Right conjunctiva is not injected.     Left eye: Left conjunctiva is not injected.  Cardiovascular:     Rate  and Rhythm: Normal rate and regular rhythm.     Heart sounds: No murmur heard.    No friction rub. No gallop.     Comments: EKG--- NSR, 76 bpm No LVH, Q waves, or ST/T wave changes  Pulmonary:     Effort: Pulmonary effort is normal.     Breath sounds: No stridor. No wheezing, rhonchi or rales.  Abdominal:     General: Abdomen is flat.     Palpations: There is no mass.     Tenderness: There is no abdominal tenderness. There is no guarding.     Hernia: No hernia is present. There is no hernia in the left inguinal area or right inguinal area.  Genitourinary:    Pubic Area: No rash.      Penis: Normal and circumcised.      Testes: Normal.     Epididymis:     Right: Normal.     Left: Normal.     Prostate: Enlarged. Not tender and no nodules present.     Rectum: Normal. Guaiac result negative. No mass, tenderness, anal fissure, external hemorrhoid or internal hemorrhoid. Normal anal tone.  Musculoskeletal:        General: Normal range of motion.     Cervical back: Neck supple.     Right lower leg: No edema.     Left lower leg: No edema.  Lymphadenopathy:     Cervical: No cervical adenopathy.     Lower Body: No right inguinal adenopathy. No left inguinal adenopathy.  Skin:    General: Skin is warm and dry.     Coloration: Skin is not pale.  Neurological:     General: No focal deficit present.     Mental Status: He is alert and oriented to person, place, and time. Mental status is at baseline.  Psychiatric:        Mood and Affect: Mood normal.        Thought Content: Thought content normal.        Judgment: Judgment normal.    Lab Results  Component Value Date   WBC 5.2 01/06/2024   HGB 12.2 (L) 01/06/2024   HCT 35.1 (L) 01/06/2024   PLT 347.0 01/06/2024   GLUCOSE 95 01/06/2024   CHOL 133 01/06/2024   TRIG 141.0 01/06/2024   HDL 35.00 (L) 01/06/2024   LDLDIRECT 96.0 07/17/2021   LDLCALC 70 01/06/2024   ALT 17 01/06/2024   AST 15 01/06/2024   NA 138 01/06/2024   K 4.0  01/06/2024   CL 105 01/06/2024   CREATININE 0.81 01/06/2024   BUN 16 01/06/2024   CO2 26 01/06/2024   TSH 2.94 02/19/2023   PSA 0.25 01/06/2024   INR 1.2 (H) 02/19/2023   HGBA1C 5.8 (H) 02/12/2020    MR Abdomen W Wo Contrast Result Date: 09/10/2021 CLINICAL DATA:  Follow-up liver lesion.  EXAM: MRI ABDOMEN WITHOUT AND WITH CONTRAST TECHNIQUE: Multiplanar multisequence MR imaging of the abdomen was performed both before and after the administration of intravenous contrast. CONTRAST:  18mL MULTIHANCE  GADOBENATE DIMEGLUMINE  529 MG/ML IV SOLN COMPARISON:  08/31/2020 FINDINGS: Lower chest: No acute findings. Hepatobiliary: Cyst within segment 3 of left lobe of liver containing a few thin internal areas of septation measures 2.2 by 1.5 cm. This is unchanged compared with 04/14/20 compatible with a benign liver cyst. No significant internal enhancement associated with this structure. No new liver lesions. The gallbladder appears normal. No bile duct dilatation. Pancreas: No mass, inflammatory changes, or other parenchymal abnormality identified. Spleen: Multiple small T2 hyperintense foci are again noted scattered throughout both lobes of liver. The largest of these measures 1.1 cm, image 17/7. Allowing for differences in technique these do not appear significantly changed in size or multiplicity. On the early postcontrast T1 weighted images these lesions are predominantly hypointense. On the delayed images there is contrast fill-in within these lesions compatible with multiple benign hemangiomas. Adrenals/Urinary Tract: Normal adrenal glands. Bilateral upper pole kidney cysts are stable from the previous exam. No suspicious enhancing kidney lesions identified. Stomach/Bowel: Visualized portions within the abdomen are unremarkable. Vascular/Lymphatic: Aortic atherosclerosis. No aneurysm. Prominent upper abdominal lymph nodes are unchanged when compared with the previous exam. For example, within the porta hepatic  region there is a stable lymph node measuring 1.1 cm, image 16/7. Adjacent lymph node measures 1.3 cm, image 15/7 and is also unchanged. No enlarged retroperitoneal or mesenteric lymph nodes. Other:  No free fluid or fluid collections. Musculoskeletal: No suspicious bone lesions identified. IMPRESSION: 1. No acute findings within the abdomen. 2. Stable appearance of multiple small T2 hyperintense foci scattered throughout both lobes of liver compatible with enhancement characteristics consistent with multiple benign hemangiomas. 3. Unchanged appearance of septated cyst within segment 3 of the liver. 4. Stable appearance of bilateral upper pole kidney cysts. Electronically Signed   By: Kimberley Penman M.D.   On: 09/10/2021 16:16    Assessment & Plan:  Need for prophylactic vaccination with combined diphtheria-tetanus-pertussis (DTP) vaccine -     Boostrix ; Inject 0.5 mLs into the muscle once for 1 dose.  Dispense: 0.5 mL; Refill: 0  Need for prophylactic vaccination and inoculation against varicella -     Shingrix ; Inject 0.5 mLs into the muscle once for 1 dose.  Dispense: 0.5 mL; Refill: 1  Liver lesion, left lobe -     Hepatic function panel; Future -     AFP tumor marker; Future  Hyperlipidemia LDL goal <100- LDL goal achieved. Doing well on the statin  -     Lipid panel; Future -     Hepatic function panel; Future -     Pravastatin  Sodium; Take 1 tablet (10 mg total) by mouth daily.  Dispense: 90 tablet; Refill: 1  Solitary pulmonary nodule -     CT CHEST WO CONTRAST; Future  Abnormal findings on diagnostic imaging of lung -     CT CHEST WO CONTRAST; Future  Thiamine deficiency -     CBC with Differential/Platelet; Future -     Vitamin B1; Future -     Vitamin B-1; Take 1 tablet (50 mg total) by mouth every other day.  Dispense: 45 tablet; Refill: 1  BPH associated with nocturia -     Urinalysis, Routine w reflex microscopic; Future -     PSA; Future  Splenic lesion -     Basic  metabolic panel  with GFR; Future -     CBC with Differential/Platelet; Future  Encounter for general adult medical examination with abnormal findings- Exam completed, labs reviewed, vaccines reviewed and updated, cancer screenings addressed, pt ed material was given.   H/O herpes zoster keratoconjunctivitis -     valACYclovir  HCl; Take 1 tablet (500 mg total) by mouth daily.  Dispense: 90 tablet; Refill: 0  Encounter for screening for coronary artery disease -     EKG 12-Lead  Deficiency anemia- Will evaluate for vit deficiencies. -     IBC + Ferritin; Future -     Reticulocytes; Future -     Zinc; Future -     Folate; Future -     Vitamin B12; Future     Follow-up: Return in about 6 months (around 07/07/2024).  Sandra Crouch, MD

## 2024-01-06 NOTE — Patient Instructions (Signed)
 Health Maintenance, Male  Adopting a healthy lifestyle and getting preventive care are important in promoting health and wellness. Ask your health care provider about:  The right schedule for you to have regular tests and exams.  Things you can do on your own to prevent diseases and keep yourself healthy.  What should I know about diet, weight, and exercise?  Eat a healthy diet    Eat a diet that includes plenty of vegetables, fruits, low-fat dairy products, and lean protein.  Do not eat a lot of foods that are high in solid fats, added sugars, or sodium.  Maintain a healthy weight  Body mass index (BMI) is a measurement that can be used to identify possible weight problems. It estimates body fat based on height and weight. Your health care provider can help determine your BMI and help you achieve or maintain a healthy weight.  Get regular exercise  Get regular exercise. This is one of the most important things you can do for your health. Most adults should:  Exercise for at least 150 minutes each week. The exercise should increase your heart rate and make you sweat (moderate-intensity exercise).  Do strengthening exercises at least twice a week. This is in addition to the moderate-intensity exercise.  Spend less time sitting. Even light physical activity can be beneficial.  Watch cholesterol and blood lipids  Have your blood tested for lipids and cholesterol at 68 years of age, then have this test every 5 years.  You may need to have your cholesterol levels checked more often if:  Your lipid or cholesterol levels are high.  You are older than 68 years of age.  You are at high risk for heart disease.  What should I know about cancer screening?  Many types of cancers can be detected early and may often be prevented. Depending on your health history and family history, you may need to have cancer screening at various ages. This may include screening for:  Colorectal cancer.  Prostate cancer.  Skin cancer.  Lung  cancer.  What should I know about heart disease, diabetes, and high blood pressure?  Blood pressure and heart disease  High blood pressure causes heart disease and increases the risk of stroke. This is more likely to develop in people who have high blood pressure readings or are overweight.  Talk with your health care provider about your target blood pressure readings.  Have your blood pressure checked:  Every 3-5 years if you are 9-95 years of age.  Every year if you are 85 years old or older.  If you are between the ages of 29 and 29 and are a current or former smoker, ask your health care provider if you should have a one-time screening for abdominal aortic aneurysm (AAA).  Diabetes  Have regular diabetes screenings. This checks your fasting blood sugar level. Have the screening done:  Once every three years after age 23 if you are at a normal weight and have a low risk for diabetes.  More often and at a younger age if you are overweight or have a high risk for diabetes.  What should I know about preventing infection?  Hepatitis B  If you have a higher risk for hepatitis B, you should be screened for this virus. Talk with your health care provider to find out if you are at risk for hepatitis B infection.  Hepatitis C  Blood testing is recommended for:  Everyone born from [birth year] through 1965.  Anyone  with known risk factors for hepatitis C.  Sexually transmitted infections (STIs)  You should be screened each year for STIs, including gonorrhea and chlamydia, if:  You are sexually active and are younger than 68 years of age.  You are older than 68 years of age and your health care provider tells you that you are at risk for this type of infection.  Your sexual activity has changed since you were last screened, and you are at increased risk for chlamydia or gonorrhea. Ask your health care provider if you are at risk.  Ask your health care provider about whether you are at high risk for HIV. Your health care provider  may recommend a prescription medicine to help prevent HIV infection. If you choose to take medicine to prevent HIV, you should first get tested for HIV. You should then be tested every 3 months for as long as you are taking the medicine.  Follow these instructions at home:  Alcohol use  Do not drink alcohol if your health care provider tells you not to drink.  If you drink alcohol:  Limit how much you have to 0-2 drinks a day.  Know how much alcohol is in your drink. In the [place] one drink equals one 12 oz bottle of beer (355 mL), one 5 oz glass of wine (148 mL), or one 1 oz glass of hard liquor (44 mL).  Lifestyle  Do not use any products that contain nicotine or tobacco. These products include cigarettes, chewing tobacco, and vaping devices, such as e-cigarettes. If you need help quitting, ask your health care provider.  Do not use street drugs.  Do not share needles.  Ask your health care provider for help if you need support or information about quitting drugs.  General instructions  Schedule regular health, dental, and eye exams.  Stay current with your vaccines.  Tell your health care provider if:  You often feel depressed.  You have ever been abused or do not feel safe at home.  Summary  Adopting a healthy lifestyle and getting preventive care are important in promoting health and wellness.  Follow your health care provider's instructions about healthy diet, exercising, and getting tested or screened for diseases.  Follow your health care provider's instructions on monitoring your cholesterol and blood pressure.  This information is not intended to replace advice given to you by your health care provider. Make sure you discuss any questions you have with your health care provider.  Document Revised: 12/12/2020 Document Reviewed: 12/12/2020  Elsevier Patient Education  2024 [employer].

## 2024-01-07 ENCOUNTER — Ambulatory Visit: Payer: Self-pay | Admitting: Internal Medicine

## 2024-01-07 DIAGNOSIS — D539 Nutritional anemia, unspecified: Secondary | ICD-10-CM | POA: Insufficient documentation

## 2024-01-07 LAB — AFP TUMOR MARKER: AFP-Tumor Marker: 2.4 ng/mL (ref <6.1)

## 2024-01-07 MED ORDER — VITAMIN B-1 50 MG PO TABS
50.0000 mg | ORAL_TABLET | ORAL | 1 refills | Status: AC
Start: 2024-01-07 — End: ?

## 2024-01-07 MED ORDER — PRAVASTATIN SODIUM 10 MG PO TABS: 10.0000 mg | ORAL_TABLET | Freq: Every day | ORAL | 1 refills | Status: AC

## 2024-01-09 LAB — VITAMIN B1: Vitamin B1 (Thiamine): 16 nmol/L (ref 8–30)

## 2024-01-16 ENCOUNTER — Ambulatory Visit
Admission: RE | Admit: 2024-01-16 | Discharge: 2024-01-16 | Disposition: A | Discharge status: Home / Self Care | Source: Ambulatory Visit | Attending: Internal Medicine | Admitting: Internal Medicine

## 2024-01-16 DIAGNOSIS — R911 Solitary pulmonary nodule: Secondary | ICD-10-CM

## 2024-01-16 DIAGNOSIS — R918 Other nonspecific abnormal finding of lung field: Secondary | ICD-10-CM | POA: Diagnosis not present

## 2024-01-22 ENCOUNTER — Other Ambulatory Visit (INDEPENDENT_AMBULATORY_CARE_PROVIDER_SITE_OTHER)

## 2024-01-22 DIAGNOSIS — D539 Nutritional anemia, unspecified: Secondary | ICD-10-CM | POA: Diagnosis not present

## 2024-01-22 LAB — IBC + FERRITIN
Ferritin: 468.9 ng/mL — ABNORMAL HIGH (ref 22.0–322.0)
Iron: 134 ug/dL (ref 42–165)
Saturation Ratios: 41.4 % (ref 20.0–50.0)
TIBC: 323.4 ug/dL (ref 250.0–450.0)
Transferrin: 231 mg/dL (ref 212.0–360.0)

## 2024-01-22 LAB — FOLATE: Folate: 14.9 ng/mL (ref >5.9)

## 2024-01-22 LAB — VITAMIN B12: Vitamin B-12: 735 pg/mL (ref 211–911)

## 2024-01-26 LAB — RETICULOCYTES
ABS Retic: 53760 {cells}/uL (ref 25000–90000)
Retic Ct Pct: 1.6 %

## 2024-01-26 LAB — ZINC: Zinc: 98 ug/dL (ref 60–130)

## 2024-02-22 ENCOUNTER — Other Ambulatory Visit: Payer: Self-pay | Admitting: Internal Medicine

## 2024-02-22 DIAGNOSIS — N401 Enlarged prostate with lower urinary tract symptoms: Secondary | ICD-10-CM

## 2024-08-11 ENCOUNTER — Other Ambulatory Visit: Payer: Self-pay | Admitting: Internal Medicine

## 2024-08-11 DIAGNOSIS — N401 Enlarged prostate with lower urinary tract symptoms: Secondary | ICD-10-CM

## 2024-08-11 NOTE — Telephone Encounter (Unsigned)
 Copied from CRM [id number]. Topic: Clinical - Medication Refill >> Aug 11, 2024  3:14 PM Brittany M wrote: Medication: tadalafil  (CIALIS ) 5 MG tablet  Has the patient contacted their pharmacy? Yes (Agent: If no, request that the patient contact the pharmacy for the refill. If patient does not wish to contact the pharmacy document the reason why and proceed with request.) (Agent: If yes, when and what did the pharmacy advise?)  This is the patient's preferred pharmacy:  [id number] - [place], [place] - [address] AT [facility] OF [address] & [address] [place] [address] Phone: [phone] Fax: [phone]   Is this the correct pharmacy for this prescription? Yes If no, delete pharmacy and type the correct one.   Has the prescription been filled recently? Yes  Is the patient out of the medication? Yes  Has the patient been seen for an appointment in the last year OR does the patient have an upcoming appointment? Yes  Can we respond through MyChart? Yes  Agent: Please be advised that Rx refills may take up to 3 business days. We ask that you follow-up with your pharmacy.

## 2024-08-12 MED ORDER — TADALAFIL 5 MG PO TABS: 5.0000 mg | ORAL_TABLET | Freq: Every day | ORAL | 0 refills | Status: AC | PRN

## 2024-11-20 ENCOUNTER — Ambulatory Visit
